# Patient Record
Sex: Female | Born: 1955 | Race: White | Hispanic: No | Marital: Married | Smoking: Former smoker
Health system: Southern US, Community
[De-identification: ages and names within clinical notes are randomized; demographics above are authoritative.]

## PROBLEM LIST (undated history)

## (undated) DIAGNOSIS — R3915 Urgency of urination: Secondary | ICD-10-CM

## (undated) DIAGNOSIS — E7800 Pure hypercholesterolemia, unspecified: Secondary | ICD-10-CM

## (undated) DIAGNOSIS — G4730 Sleep apnea, unspecified: Secondary | ICD-10-CM

## (undated) DIAGNOSIS — K5792 Diverticulitis of intestine, part unspecified, without perforation or abscess without bleeding: Secondary | ICD-10-CM

## (undated) DIAGNOSIS — F329 Major depressive disorder, single episode, unspecified: Secondary | ICD-10-CM

## (undated) DIAGNOSIS — D649 Anemia, unspecified: Secondary | ICD-10-CM

## (undated) DIAGNOSIS — N159 Renal tubulo-interstitial disease, unspecified: Secondary | ICD-10-CM

## (undated) DIAGNOSIS — K219 Gastro-esophageal reflux disease without esophagitis: Secondary | ICD-10-CM

## (undated) DIAGNOSIS — K2270 Barrett's esophagus without dysplasia: Secondary | ICD-10-CM

## (undated) DIAGNOSIS — G629 Polyneuropathy, unspecified: Secondary | ICD-10-CM

## (undated) DIAGNOSIS — I219 Acute myocardial infarction, unspecified: Secondary | ICD-10-CM

## (undated) DIAGNOSIS — F32A Depression, unspecified: Secondary | ICD-10-CM

## (undated) DIAGNOSIS — I2510 Atherosclerotic heart disease of native coronary artery without angina pectoris: Secondary | ICD-10-CM

## (undated) DIAGNOSIS — E119 Type 2 diabetes mellitus without complications: Secondary | ICD-10-CM

## (undated) DIAGNOSIS — K3184 Gastroparesis: Secondary | ICD-10-CM

## (undated) DIAGNOSIS — M1990 Unspecified osteoarthritis, unspecified site: Secondary | ICD-10-CM

## (undated) DIAGNOSIS — J449 Chronic obstructive pulmonary disease, unspecified: Secondary | ICD-10-CM

## (undated) DIAGNOSIS — I10 Essential (primary) hypertension: Secondary | ICD-10-CM

## (undated) DIAGNOSIS — J302 Other seasonal allergic rhinitis: Secondary | ICD-10-CM

## (undated) HISTORY — PX: ABDOMINAL HYSTERECTOMY: SHX81

## (undated) HISTORY — PX: CORONARY ANGIOPLASTY: SHX604

## (undated) HISTORY — PX: TUBAL LIGATION: SHX77

## (undated) HISTORY — PX: CHOLECYSTECTOMY: SHX55

## (undated) HISTORY — PX: BLADDER SUSPENSION: SHX72

## (undated) HISTORY — PX: JOINT REPLACEMENT: SHX530

## (undated) HISTORY — PX: NASAL SINUS SURGERY: SHX719

## (undated) HISTORY — PX: KNEE ARTHROSCOPY: SUR90

---

## 1981-03-23 DIAGNOSIS — L409 Psoriasis, unspecified: Secondary | ICD-10-CM

## 1981-03-23 HISTORY — DX: Psoriasis, unspecified: L40.9

## 1990-03-23 DIAGNOSIS — H9191 Unspecified hearing loss, right ear: Secondary | ICD-10-CM

## 1990-03-23 HISTORY — DX: Unspecified hearing loss, right ear: H91.91

## 1999-03-24 DIAGNOSIS — B259 Cytomegaloviral disease, unspecified: Secondary | ICD-10-CM

## 1999-03-24 HISTORY — DX: Cytomegaloviral disease, unspecified: B25.9

## 2001-03-23 HISTORY — PX: EYE SURGERY: SHX253

## 2008-09-20 DIAGNOSIS — I219 Acute myocardial infarction, unspecified: Secondary | ICD-10-CM

## 2008-09-20 HISTORY — DX: Acute myocardial infarction, unspecified: I21.9

## 2009-07-08 ENCOUNTER — Inpatient Hospital Stay (HOSPITAL_COMMUNITY): Admission: RE | Admit: 2009-07-08 | Discharge: 2009-07-10 | Payer: Self-pay | Admitting: Orthopedic Surgery

## 2010-03-23 HISTORY — PX: DILATION AND CURETTAGE OF UTERUS: SHX78

## 2010-06-10 LAB — BASIC METABOLIC PANEL
BUN: 12 mg/dL (ref 6–23)
BUN: 9 mg/dL (ref 6–23)
CO2: 28 mEq/L (ref 19–32)
Calcium: 8.5 mg/dL (ref 8.4–10.5)
Calcium: 8.5 mg/dL (ref 8.4–10.5)
Chloride: 107 mEq/L (ref 96–112)
GFR calc non Af Amer: >60 mL/min (ref >60)
GFR calc non Af Amer: >60 mL/min (ref >60)
Glucose, Bld: 104 mg/dL — ABNORMAL HIGH (ref 70–99)
Glucose, Bld: 109 mg/dL — ABNORMAL HIGH (ref 70–99)
Potassium: 4.1 mEq/L (ref 3.5–5.1)

## 2010-06-10 LAB — CBC
Hemoglobin: 10.8 g/dL — ABNORMAL LOW (ref 12.0–15.0)
MCHC: 33.4 g/dL (ref 30.0–36.0)
MCHC: 33.7 g/dL (ref 30.0–36.0)
RBC: 3.49 MIL/uL — ABNORMAL LOW (ref 3.87–5.11)
WBC: 7.3 10*3/uL (ref 4.0–10.5)

## 2010-06-10 LAB — TYPE AND SCREEN: Antibody Screen: NEGATIVE

## 2010-06-11 LAB — DIFFERENTIAL
Basophils Absolute: 0 10*3/uL (ref 0.0–0.1)
Basophils Relative: 1 % (ref 0–1)
Eosinophils Absolute: 0.2 10*3/uL (ref 0.0–0.7)
Eosinophils Relative: 3 % (ref 0–5)
Lymphocytes Relative: 39 % (ref 12–46)
Monocytes Absolute: 0.4 10*3/uL (ref 0.1–1.0)
Neutro Abs: 3.4 10*3/uL (ref 1.7–7.7)

## 2010-06-11 LAB — BASIC METABOLIC PANEL
BUN: 13 mg/dL (ref 6–23)
Chloride: 107 mEq/L (ref 96–112)
GFR calc Af Amer: >60 mL/min (ref >60)
Potassium: 3.6 mEq/L (ref 3.5–5.1)
Sodium: 141 mEq/L (ref 135–145)

## 2010-06-11 LAB — URINALYSIS, ROUTINE W REFLEX MICROSCOPIC
Ketones, ur: NEGATIVE mg/dL
Nitrite: NEGATIVE
Protein, ur: NEGATIVE mg/dL
Urobilinogen, UA: 0.2 mg/dL (ref 0.0–1.0)

## 2010-06-11 LAB — CBC
HCT: 41.6 % (ref 36.0–46.0)
Hemoglobin: 13.9 g/dL (ref 12.0–15.0)

## 2011-12-25 ENCOUNTER — Ambulatory Visit (INDEPENDENT_AMBULATORY_CARE_PROVIDER_SITE_OTHER): Payer: BC Managed Care – PPO

## 2011-12-25 ENCOUNTER — Other Ambulatory Visit: Payer: Self-pay | Admitting: Orthopedic Surgery

## 2011-12-25 DIAGNOSIS — R52 Pain, unspecified: Secondary | ICD-10-CM

## 2011-12-25 DIAGNOSIS — Z96659 Presence of unspecified artificial knee joint: Secondary | ICD-10-CM

## 2011-12-25 DIAGNOSIS — W19XXXA Unspecified fall, initial encounter: Secondary | ICD-10-CM

## 2011-12-25 DIAGNOSIS — M25559 Pain in unspecified hip: Secondary | ICD-10-CM

## 2013-12-06 ENCOUNTER — Encounter (HOSPITAL_COMMUNITY): Payer: Self-pay | Admitting: Pharmacy Technician

## 2013-12-12 NOTE — Patient Instructions (Signed)
20 Kayla Bailey  12/12/2013   Your procedure is scheduled on:     12/19/2013  Report to [facility] Main Entrance and follow signs to  Short Stay Center  arrive at 1120 AM.   Call this number if you have problems the morning of surgery [phone] or Presurgical Testing [phone].   Remember:  Do not eat food After Midnight, ,may have clear liquids until 0730 am day of surgery then nothing by mouth.      Take these medicines the morning of surgery with A SIP OF WATER: Cymbalta;Gabapentin;Pantroprazole;Inderal                               You may not have any metal on your body including hair pins and piercings  Do not wear jewelry, make-up, lotions, powders, or deodorant.  Do not shave body hair  48 hours(2 days) of CHG soap use.               Do not bring valuables to the hospital. [facility] IS NOT RESPONSIBLE FOR VALUABLES.  Contacts, dentures or bridgework may not be worn into surgery.  Leave suitcase in the car. After surgery it may be brought to your room.  For patients admitted to the hospital, checkout time is 11:00 AM the day of discharge.   Special Instructions: review fact sheets for MRSA information, Blood Transfusion fact sheet, Incentive Spirometry.  Remember: Type/Screen "Blue armsbands"- may not be removed once applied(would result in being retested AM of surgery, if removed). ________________________________________________________________________  [facility] - Preparing for Surgery Before surgery, you can play an important role.  Because skin is not sterile, your skin needs to be as free of germs as possible.  You can reduce the number of germs on your skin by washing with CHG (chlorahexidine gluconate) soap before surgery.  CHG is an antiseptic cleaner which kills germs and bonds with the skin to continue killing germs even after washing. Please DO NOT use if you have an allergy to CHG or antibacterial soaps.  If your skin becomes reddened/irritated  stop using the CHG and inform your nurse when you arrive at Short Stay. Do not shave (including legs and underarms) for at least 48 hours prior to the first CHG shower.  You may shave your face/neck. Please follow these instructions carefully:  1.  Shower with CHG Soap the night before surgery and the  morning of Surgery.  2.  If you choose to wash your hair, wash your hair first as usual with your  normal  shampoo.  3.  After you shampoo, rinse your hair and body thoroughly to remove the  shampoo.                           4.  Use CHG as you would any other liquid soap.  You can apply chg directly  to the skin and wash                       Gently with a scrungie or clean washcloth.  5.  Apply the CHG Soap to your body ONLY FROM THE NECK DOWN.   Do not use on face/ open                           Wound or open sores. Avoid contact  with eyes, ears mouth and genitals (private parts).                       Wash face,  Genitals (private parts) with your normal soap.             6.  Wash thoroughly, paying special attention to the area where your surgery  will be performed.  7.  Thoroughly rinse your body with warm water from the neck down.  8.  DO NOT shower/wash with your normal soap after using and rinsing off  the CHG Soap.                9.  Pat yourself dry with a clean towel.            10.  Wear clean pajamas.            11.  Place clean sheets on your bed the night of your first shower and do not  sleep with pets. Day of Surgery : Do not apply any lotions/deodorants the morning of surgery.  Please wear clean clothes to the hospital/surgery center.  FAILURE TO FOLLOW THESE INSTRUCTIONS MAY RESULT IN THE CANCELLATION OF YOUR SURGERY PATIENT SIGNATURE_________________________________  NURSE SIGNATURE__________________________________  ________________________________________________________________________    CLEAR LIQUID DIET   Foods Allowed                                                                      Foods Excluded  Coffee and tea, regular and decaf                             liquids that you cannot  Plain Jell-O in any flavor                                             see through such as: Fruit ices (not with fruit pulp)                                     milk, soups, orange juice  Iced Popsicles                                    All solid food Carbonated beverages, regular and diet                                    Cranberry, grape and apple juices Sports drinks like Gatorade Lightly seasoned clear broth or consume(fat free) Sugar, honey syrup  Sample Menu Breakfast                                Lunch  Supper Cranberry juice                    Beef broth                            Chicken broth Jell-O                                     Grape juice                           Apple juice Coffee or tea                        Jell-O                                      Popsicle                                                Coffee or tea                        Coffee or tea  _____________________________________________________________________    Incentive Spirometer  An incentive spirometer is a tool that can help keep your lungs clear and active. This tool measures how well you are filling your lungs with each breath. Taking long deep breaths may help reverse or decrease the chance of developing breathing (pulmonary) problems (especially infection) following:  A long period of time when you are unable to move or be active. BEFORE THE PROCEDURE   If the spirometer includes an indicator to show your best effort, your nurse or respiratory therapist will set it to a desired goal.  If possible, sit up straight or lean slightly forward. Try not to slouch.  Hold the incentive spirometer in an upright position. INSTRUCTIONS FOR USE  1. Sit on the edge of your bed if possible, or sit up as far as you can in bed or on a  chair. 2. Hold the incentive spirometer in an upright position. 3. Breathe out normally. 4. Place the mouthpiece in your mouth and seal your lips tightly around it. 5. Breathe in slowly and as deeply as possible, raising the piston or the ball toward the top of the column. 6. Hold your breath for 3-5 seconds or for as long as possible. Allow the piston or ball to fall to the bottom of the column. 7. Remove the mouthpiece from your mouth and breathe out normally. 8. Rest for a few seconds and repeat Steps 1 through 7 at least 10 times every 1-2 hours when you are awake. Take your time and take a few normal breaths between deep breaths. 9. The spirometer may include an indicator to show your best effort. Use the indicator as a goal to work toward during each repetition. 10. After each set of 10 deep breaths, practice coughing to be sure your lungs are clear. If you have an incision (the cut made at the time of surgery), support your incision when coughing by placing a pillow or rolled up towels firmly against  it. Once you are able to get out of bed, walk around indoors and cough well. You may stop using the incentive spirometer when instructed by your caregiver.  RISKS AND COMPLICATIONS  Take your time so you do not get dizzy or light-headed.  If you are in pain, you may need to take or ask for pain medication before doing incentive spirometry. It is harder to take a deep breath if you are having pain. AFTER USE  Rest and breathe slowly and easily.  It can be helpful to keep track of a log of your progress. Your caregiver can provide you with a simple table to help with this. If you are using the spirometer at home, follow these instructions: SEEK MEDICAL CARE IF:   You are having difficultly using the spirometer.  You have trouble using the spirometer as often as instructed.  Your pain medication is not giving enough relief while using the spirometer.  You develop fever of 100.5 F  (38.1 C) or higher. SEEK IMMEDIATE MEDICAL CARE IF:   You cough up bloody sputum that had not been present before.  You develop fever of 102 F (38.9 C) or greater.  You develop worsening pain at or near the incision site. MAKE SURE YOU:   Understand these instructions.  Will watch your condition.  Will get help right away if you are not doing well or get worse. Document Released: 07/20/2006 Document Revised: 06/01/2011 Document Reviewed: 09/20/2006 ExitCare Patient Information 2014 ExitCare, [place].   ________________________________________________________________________  WHAT IS A BLOOD TRANSFUSION? Blood Transfusion Information  A transfusion is the replacement of blood or some of its parts. Blood is made up of multiple cells which provide different functions.  Red blood cells carry oxygen and are used for blood loss replacement.  White blood cells fight against infection.  Platelets control bleeding.  Plasma helps clot blood.  Other blood products are available for specialized needs, such as hemophilia or other clotting disorders. BEFORE THE TRANSFUSION  Who gives blood for transfusions?   Healthy volunteers who are fully evaluated to make sure their blood is safe. This is blood bank blood. Transfusion therapy is the safest it has ever been in the practice of medicine. Before blood is taken from a donor, a complete history is taken to make sure that person has no history of diseases nor engages in risky social behavior (examples are intravenous drug use or sexual activity with multiple partners). The donor's travel history is screened to minimize risk of transmitting infections, such as malaria. The donated blood is tested for signs of infectious diseases, such as HIV and hepatitis. The blood is then tested to be sure it is compatible with you in order to minimize the chance of a transfusion reaction. If you or a relative donates blood, this is often done in anticipation  of surgery and is not appropriate for emergency situations. It takes many days to process the donated blood. RISKS AND COMPLICATIONS Although transfusion therapy is very safe and saves many lives, the main dangers of transfusion include:   Getting an infectious disease.  Developing a transfusion reaction. This is an allergic reaction to something in the blood you were given. Every precaution is taken to prevent this. The decision to have a blood transfusion has been considered carefully by your caregiver before blood is given. Blood is not given unless the benefits outweigh the risks. AFTER THE TRANSFUSION  Right after receiving a blood transfusion, you will usually feel much better and more  energetic. This is especially true if your red blood cells have gotten low (anemic). The transfusion raises the level of the red blood cells which carry oxygen, and this usually causes an energy increase.  The nurse administering the transfusion will monitor you carefully for complications. HOME CARE INSTRUCTIONS  No special instructions are needed after a transfusion. You may find your energy is better. Speak with your caregiver about any limitations on activity for underlying diseases you may have. SEEK MEDICAL CARE IF:   Your condition is not improving after your transfusion.  You develop redness or irritation at the intravenous (IV) site. SEEK IMMEDIATE MEDICAL CARE IF:  Any of the following symptoms occur over the next 12 hours:  Shaking chills.  You have a temperature by mouth above 102 F (38.9 C), not controlled by medicine.  Chest, back, or muscle pain.  People around you feel you are not acting correctly or are confused.  Shortness of breath or difficulty breathing.  Dizziness and fainting.  You get a rash or develop hives.  You have a decrease in urine output.  Your urine turns a dark color or changes to pink, red, or brown. Any of the following symptoms occur over the next 10  days:  You have a temperature by mouth above 102 F (38.9 C), not controlled by medicine.  Shortness of breath.  Weakness after normal activity.  The white part of the eye turns yellow (jaundice).  You have a decrease in the amount of urine or are urinating less often.  Your urine turns a dark color or changes to pink, red, or brown. Document Released: 03/06/2000 Document Revised: 06/01/2011 Document Reviewed: 10/24/2007 [facility] Patient Information 2014 [place], [place].  _______________________________________________________________________

## 2013-12-13 ENCOUNTER — Encounter (HOSPITAL_COMMUNITY): Payer: Self-pay

## 2013-12-13 ENCOUNTER — Encounter (HOSPITAL_COMMUNITY)
Admission: RE | Admit: 2013-12-13 | Discharge: 2013-12-13 | Disposition: A | Discharge status: Home / Self Care | Payer: BC Managed Care – PPO | Source: Ambulatory Visit | Attending: Orthopedic Surgery | Admitting: Orthopedic Surgery

## 2013-12-13 ENCOUNTER — Ambulatory Visit (HOSPITAL_COMMUNITY)
Admission: RE | Admit: 2013-12-13 | Discharge: 2013-12-13 | Disposition: A | Discharge status: Home / Self Care | Payer: BC Managed Care – PPO | Source: Ambulatory Visit | Attending: Orthopedic Surgery | Admitting: Orthopedic Surgery

## 2013-12-13 DIAGNOSIS — I10 Essential (primary) hypertension: Secondary | ICD-10-CM | POA: Diagnosis not present

## 2013-12-13 HISTORY — DX: Sleep apnea, unspecified: G47.30

## 2013-12-13 HISTORY — DX: Essential (primary) hypertension: I10

## 2013-12-13 HISTORY — DX: Polyneuropathy, unspecified: G62.9

## 2013-12-13 HISTORY — DX: Urgency of urination: R39.15

## 2013-12-13 HISTORY — DX: Acute myocardial infarction, unspecified: I21.9

## 2013-12-13 HISTORY — DX: Gastro-esophageal reflux disease without esophagitis: K21.9

## 2013-12-13 HISTORY — DX: Anemia, unspecified: D64.9

## 2013-12-13 HISTORY — DX: Unspecified osteoarthritis, unspecified site: M19.90

## 2013-12-13 LAB — URINALYSIS, ROUTINE W REFLEX MICROSCOPIC
Bilirubin Urine: NEGATIVE
GLUCOSE, UA: NEGATIVE mg/dL
Hgb urine dipstick: NEGATIVE
Ketones, ur: NEGATIVE mg/dL
LEUKOCYTES UA: NEGATIVE
Nitrite: NEGATIVE
PH: 6.5 (ref 5.0–8.0)
Protein, ur: NEGATIVE mg/dL
Specific Gravity, Urine: 1.006 (ref 1.005–1.030)
Urobilinogen, UA: 0.2 mg/dL (ref 0.0–1.0)

## 2013-12-13 LAB — CBC
HEMATOCRIT: 30.5 % — AB (ref 36.0–46.0)
Hemoglobin: 9.2 g/dL — ABNORMAL LOW (ref 12.0–15.0)
MCH: 23 pg — ABNORMAL LOW (ref 26.0–34.0)
MCHC: 30.2 g/dL (ref 30.0–36.0)
MCV: 76.3 fL — AB (ref 78.0–100.0)
PLATELETS: 337 10*3/uL (ref 150–400)
RBC: 4 MIL/uL (ref 3.87–5.11)
RDW: 16.8 % — AB (ref 11.5–15.5)
WBC: 5 10*3/uL (ref 4.0–10.5)

## 2013-12-13 LAB — BASIC METABOLIC PANEL
ANION GAP: 12 (ref 5–15)
BUN: 11 mg/dL (ref 6–23)
CALCIUM: 8.8 mg/dL (ref 8.4–10.5)
CO2: 23 meq/L (ref 19–32)
CREATININE: 0.73 mg/dL (ref 0.50–1.10)
Chloride: 103 mEq/L (ref 96–112)
GFR calc Af Amer: >90 mL/min (ref >90)
GFR calc non Af Amer: >90 mL/min (ref >90)
Glucose, Bld: 127 mg/dL — ABNORMAL HIGH (ref 70–99)
Potassium: 4.1 mEq/L (ref 3.7–5.3)
Sodium: 138 mEq/L (ref 137–147)

## 2013-12-13 LAB — SURGICAL PCR SCREEN
MRSA, PCR: NEGATIVE
Staphylococcus aureus: POSITIVE — AB

## 2013-12-13 LAB — PROTIME-INR
INR: 1.03 (ref 0.00–1.49)
Prothrombin Time: 13.5 seconds (ref 11.6–15.2)

## 2013-12-13 LAB — APTT: aPTT: 33 seconds (ref 24–37)

## 2013-12-13 NOTE — Progress Notes (Signed)
Email sent to patient at [email or web address] for EMMI education.  Booklet also given to patient.

## 2013-12-13 NOTE — Progress Notes (Signed)
LOV ntoe DR Zena Amos( cardiology ) on chart- 11/10/2013.  EKG on chart- 11/10/2013.   Clearance- Dr Zena Amos on chart.

## 2013-12-13 NOTE — Progress Notes (Signed)
CBC results faxed via EPIC to Dr Olin.   

## 2013-12-16 NOTE — H&P (Signed)
TOTAL KNEE ADMISSION H&P  Patient is being admitted for left total knee arthroplasty.  Subjective:  Chief Complaint:    Left knee OA / pain.  HPI: Kayla Bailey, 58 y.o. female, has a history of pain and functional disability in the left knee due to arthritis and has failed non-surgical conservative treatments for greater than 12 weeks to includeNSAID's and/or analgesics, corticosteriod injections, use of assistive devices and activity modification.  Onset of symptoms was gradual, starting 4+ years ago with gradually worsening course since that time. The patient noted prior procedures on the knee to include  arthroplasty on the right knee in 2011 per Dr. Charlann Boxer.  Patient currently rates pain in the left knee(s) at 8 out of 10 with activity. Patient has night pain, worsening of pain with activity and weight bearing, pain that interferes with activities of daily living, pain with passive range of motion, crepitus and joint swelling.  Patient has evidence of periarticular osteophytes and joint space narrowing by imaging studies. There is no active infection. Risks, benefits and expectations were discussed with the patient.  Risks including but not limited to the risk of anesthesia, blood clots, nerve damage, blood vessel damage, failure of the prosthesis, infection and up to and including death.  Patient understand the risks, benefits and expectations and wishes to proceed with surgery.   PCP: Garlon Hatchet, MD  D/C Plans:      Home with HHPT  Post-op Meds:       No Rx given   Tranexamic Acid:      To be given - topically (previous MI)  Decadron:      Is to be given  FYI:     Plavix and ASA post-op  Norco post-op  (patient states that they are good with Norco, even with Codeine allergy)   Past Medical History  Diagnosis Date  . Hypertension   . Myocardial infarction 09/2008   . Sleep apnea     cpap-   . Urgency of urination   . GERD (gastroesophageal reflux disease)   . Arthritis     osteoarthritis, psoriatic arthritis   . Anemia     hx of years ago   . Peripheral neuropathy     Past Surgical History  Procedure Laterality Date  . Tubal ligation    . Cholecystectomy    . Knee arthroscopy      right knee   . Joint replacement      right knee - 2011     No prescriptions prior to admission   Allergies  Allergen Reactions  . Codeine Itching and Nausea Only    History  Substance Use Topics  . Smoking status: Current Every Day Smoker -- 1.00 packs/day for 42 years    Types: Cigarettes  . Smokeless tobacco: Never Used  . Alcohol Use: Yes     Comment: rare    No family history on file.   Review of Systems  Constitutional: Positive for malaise/fatigue.  HENT: Positive for hearing loss and tinnitus.   Eyes: Negative.   Respiratory: Negative.   Cardiovascular: Negative.   Gastrointestinal: Positive for heartburn.  Genitourinary: Positive for urgency and frequency.  Musculoskeletal: Positive for joint pain.  Skin: Negative.   Neurological: Negative.   Endo/Heme/Allergies: Negative.   Psychiatric/Behavioral: Negative.     Objective:  Physical Exam  Constitutional: She is oriented to person, place, and time. She appears well-developed and well-nourished.  HENT:  Head: Normocephalic and atraumatic.  Eyes: Pupils are equal, round, and  reactive to light.  Neck: Neck supple. No JVD present. No tracheal deviation present. No thyromegaly present.  Cardiovascular: Normal rate, regular rhythm, normal heart sounds and intact distal pulses.   Respiratory: Effort normal and breath sounds normal. No stridor. No respiratory distress. She has no wheezes.  GI: Soft. There is no tenderness. There is no guarding.  Musculoskeletal:       Left knee: She exhibits decreased range of motion, swelling, abnormal alignment (valgus) and bony tenderness. She exhibits no ecchymosis, no deformity, no laceration and no erythema. Tenderness found.  Lymphadenopathy:    She has no  cervical adenopathy.  Neurological: She is alert and oriented to person, place, and time. A sensory deficit (some neuropathy of the feet) is present.  Skin: Skin is warm and dry.  Psychiatric: She has a normal mood and affect.     Imaging Review Plain radiographs demonstrate severe degenerative joint disease of the left knee(s). The overall alignment is significant valgus. The bone quality appears to be good for age and reported activity level.  Assessment/Plan:  End stage arthritis, left knee   The patient history, physical examination, clinical judgment of the provider and imaging studies are consistent with end stage degenerative joint disease of the left knee(s) and total knee arthroplasty is deemed medically necessary. The treatment options including medical management, injection therapy arthroscopy and arthroplasty were discussed at length. The risks and benefits of total knee arthroplasty were presented and reviewed. The risks due to aseptic loosening, infection, stiffness, patella tracking problems, thromboembolic complications and other imponderables were discussed. The patient acknowledged the explanation, agreed to proceed with the plan and consent was signed. Patient is being admitted for inpatient treatment for surgery, pain control, PT, OT, prophylactic antibiotics, VTE prophylaxis, progressive ambulation and ADL's and discharge planning. The patient is planning to be discharged home with home health services.    Anastasio Auerbach Baran Kuhrt   PA-C  12/16/2013, 12:13 AM

## 2013-12-18 NOTE — Progress Notes (Signed)
Patient calls and wants to know if she should take CHG showers preop as her psoriasis has flared up since holding methotrixate and remicade. She states she has a few open areas ; however, none on her left knee. RN instructs her to take showers.

## 2013-12-19 ENCOUNTER — Inpatient Hospital Stay (HOSPITAL_COMMUNITY): Payer: BC Managed Care – PPO | Admitting: Certified Registered"

## 2013-12-19 ENCOUNTER — Inpatient Hospital Stay (HOSPITAL_COMMUNITY)
Admission: RE | Admit: 2013-12-19 | Discharge: 2013-12-20 | DRG: 470 | Disposition: A | Discharge status: Home Health | Payer: BC Managed Care – PPO | Source: Ambulatory Visit | Attending: Orthopedic Surgery | Admitting: Orthopedic Surgery

## 2013-12-19 ENCOUNTER — Encounter (HOSPITAL_COMMUNITY)
Admission: RE | Disposition: A | Discharge status: Home Health | Payer: Self-pay | Source: Ambulatory Visit | Attending: Orthopedic Surgery

## 2013-12-19 ENCOUNTER — Encounter (HOSPITAL_COMMUNITY): Payer: BC Managed Care – PPO | Admitting: Certified Registered"

## 2013-12-19 ENCOUNTER — Encounter (HOSPITAL_COMMUNITY): Payer: Self-pay | Admitting: *Deleted

## 2013-12-19 DIAGNOSIS — F17200 Nicotine dependence, unspecified, uncomplicated: Secondary | ICD-10-CM | POA: Diagnosis present

## 2013-12-19 DIAGNOSIS — Z96659 Presence of unspecified artificial knee joint: Secondary | ICD-10-CM | POA: Diagnosis not present

## 2013-12-19 DIAGNOSIS — Z96652 Presence of left artificial knee joint: Secondary | ICD-10-CM

## 2013-12-19 DIAGNOSIS — K219 Gastro-esophageal reflux disease without esophagitis: Secondary | ICD-10-CM | POA: Diagnosis present

## 2013-12-19 DIAGNOSIS — M1710 Unilateral primary osteoarthritis, unspecified knee: Secondary | ICD-10-CM | POA: Diagnosis present

## 2013-12-19 DIAGNOSIS — I10 Essential (primary) hypertension: Secondary | ICD-10-CM | POA: Diagnosis present

## 2013-12-19 DIAGNOSIS — M6580 Other synovitis and tenosynovitis, unspecified site: Secondary | ICD-10-CM | POA: Diagnosis present

## 2013-12-19 DIAGNOSIS — I252 Old myocardial infarction: Secondary | ICD-10-CM | POA: Diagnosis not present

## 2013-12-19 DIAGNOSIS — G4730 Sleep apnea, unspecified: Secondary | ICD-10-CM | POA: Diagnosis present

## 2013-12-19 DIAGNOSIS — M25569 Pain in unspecified knee: Secondary | ICD-10-CM | POA: Diagnosis present

## 2013-12-19 HISTORY — PX: TOTAL KNEE ARTHROPLASTY: SHX125

## 2013-12-19 LAB — TYPE AND SCREEN
ABO/RH(D): A POS
Antibody Screen: NEGATIVE

## 2013-12-19 SURGERY — ARTHROPLASTY, KNEE, TOTAL
Anesthesia: Spinal | Site: Knee | Laterality: Left

## 2013-12-19 MED ORDER — PANTOPRAZOLE SODIUM 40 MG PO TBEC
40.0000 mg | DELAYED_RELEASE_TABLET | Freq: Every morning | ORAL | Status: DC
  Administered 2013-12-20: 40 mg via ORAL
  Filled 2013-12-19: qty 1

## 2013-12-19 MED ORDER — CEFAZOLIN SODIUM-DEXTROSE 2-3 GM-% IV SOLR
2.0000 g | INTRAVENOUS | Status: AC
  Administered 2013-12-19: 2 g via INTRAVENOUS

## 2013-12-19 MED ORDER — SODIUM CHLORIDE 0.9 % IR SOLN
Status: DC | PRN
  Administered 2013-12-19: 1000 mL

## 2013-12-19 MED ORDER — MIDAZOLAM HCL 5 MG/5ML IJ SOLN
INTRAMUSCULAR | Status: DC | PRN
  Administered 2013-12-19: 2 mg via INTRAVENOUS

## 2013-12-19 MED ORDER — ONDANSETRON HCL 4 MG/2ML IJ SOLN: 4.0000 mg | Freq: Four times a day (QID) | INTRAMUSCULAR | Status: DC | PRN

## 2013-12-19 MED ORDER — KETOROLAC TROMETHAMINE 30 MG/ML IJ SOLN
INTRAMUSCULAR | Status: DC | PRN
  Administered 2013-12-19: 30 mg via INTRAVENOUS

## 2013-12-19 MED ORDER — GABAPENTIN 600 MG PO TABS
1200.0000 mg | ORAL_TABLET | Freq: Two times a day (BID) | ORAL | Status: DC
  Filled 2013-12-19: qty 2

## 2013-12-19 MED ORDER — ONDANSETRON HCL 4 MG/2ML IJ SOLN
INTRAMUSCULAR | Status: DC | PRN
  Administered 2013-12-19: 4 mg via INTRAVENOUS

## 2013-12-19 MED ORDER — SENNA 8.6 MG PO TABS
1.0000 | ORAL_TABLET | Freq: Two times a day (BID) | ORAL | Status: DC
  Administered 2013-12-19 – 2013-12-20 (×2): 8.6 mg via ORAL

## 2013-12-19 MED ORDER — DEXAMETHASONE SODIUM PHOSPHATE 10 MG/ML IJ SOLN
10.0000 mg | Freq: Once | INTRAMUSCULAR | Status: AC
  Administered 2013-12-19: 10 mg via INTRAVENOUS

## 2013-12-19 MED ORDER — DSS 100 MG PO CAPS: 100.0000 mg | ORAL_CAPSULE | Freq: Two times a day (BID) | ORAL | Status: DC

## 2013-12-19 MED ORDER — METOCLOPRAMIDE HCL 5 MG/ML IJ SOLN: 5.0000 mg | Freq: Three times a day (TID) | INTRAMUSCULAR | Status: DC | PRN

## 2013-12-19 MED ORDER — ATORVASTATIN CALCIUM 80 MG PO TABS
80.0000 mg | ORAL_TABLET | Freq: Every day | ORAL | Status: DC
  Administered 2013-12-19: 80 mg via ORAL
  Filled 2013-12-19 (×2): qty 1

## 2013-12-19 MED ORDER — METHOCARBAMOL 500 MG PO TABS
500.0000 mg | ORAL_TABLET | Freq: Four times a day (QID) | ORAL | Status: DC | PRN
  Administered 2013-12-19 – 2013-12-20 (×2): 500 mg via ORAL
  Filled 2013-12-19 (×4): qty 1

## 2013-12-19 MED ORDER — PROPOFOL 10 MG/ML IV BOLUS
INTRAVENOUS | Status: DC | PRN
  Administered 2013-12-19: 20 mg via INTRAVENOUS

## 2013-12-19 MED ORDER — HYDROCODONE-ACETAMINOPHEN 7.5-325 MG PO TABS
1.0000 | ORAL_TABLET | ORAL | Status: DC
  Administered 2013-12-19: 1 via ORAL
  Administered 2013-12-19: 2 via ORAL
  Administered 2013-12-19: 1 via ORAL
  Administered 2013-12-20 (×3): 2 via ORAL
  Filled 2013-12-19: qty 2
  Filled 2013-12-19: qty 1
  Filled 2013-12-19 (×4): qty 2

## 2013-12-19 MED ORDER — ZOLPIDEM TARTRATE 5 MG PO TABS: 5.0000 mg | ORAL_TABLET | Freq: Every evening | ORAL | Status: DC | PRN

## 2013-12-19 MED ORDER — PROPRANOLOL HCL 60 MG PO TABS
60.0000 mg | ORAL_TABLET | Freq: Every morning | ORAL | Status: DC
  Administered 2013-12-20: 60 mg via ORAL
  Filled 2013-12-19: qty 1

## 2013-12-19 MED ORDER — PROPOFOL INFUSION 10 MG/ML OPTIME
INTRAVENOUS | Status: DC | PRN
  Administered 2013-12-19: 100 ug/kg/min via INTRAVENOUS

## 2013-12-19 MED ORDER — BUPIVACAINE IN DEXTROSE 0.75-8.25 % IT SOLN
INTRATHECAL | Status: DC | PRN
  Administered 2013-12-19: 1.8 mL via INTRATHECAL

## 2013-12-19 MED ORDER — ONDANSETRON HCL 4 MG/2ML IJ SOLN
INTRAMUSCULAR | Status: AC
  Filled 2013-12-19: qty 2

## 2013-12-19 MED ORDER — ASPIRIN EC 81 MG PO TBEC
81.0000 mg | DELAYED_RELEASE_TABLET | Freq: Every day | ORAL | Status: DC
  Administered 2013-12-20: 81 mg via ORAL
  Filled 2013-12-19: qty 1

## 2013-12-19 MED ORDER — ALUM & MAG HYDROXIDE-SIMETH 200-200-20 MG/5ML PO SUSP: 30.0000 mL | ORAL | Status: DC | PRN

## 2013-12-19 MED ORDER — CLOPIDOGREL BISULFATE 75 MG PO TABS
75.0000 mg | ORAL_TABLET | Freq: Every morning | ORAL | Status: DC
Start: 2013-12-20 — End: 2013-12-20
  Administered 2013-12-20: 75 mg via ORAL
  Filled 2013-12-19: qty 1

## 2013-12-19 MED ORDER — FOLIC ACID 1 MG PO TABS
1.0000 mg | ORAL_TABLET | Freq: Every day | ORAL | Status: DC
  Administered 2013-12-19: 1 mg via ORAL
  Filled 2013-12-19 (×2): qty 1

## 2013-12-19 MED ORDER — LACTATED RINGERS IV SOLN
INTRAVENOUS | Status: AC
Start: 2013-12-19 — End: 2013-12-20
  Administered 2013-12-19: 14:00:00 via INTRAVENOUS
  Administered 2013-12-19: 1000 mL via INTRAVENOUS

## 2013-12-19 MED ORDER — MEPERIDINE HCL 50 MG/ML IJ SOLN: 6.2500 mg | INTRAMUSCULAR | Status: DC | PRN

## 2013-12-19 MED ORDER — BUPIVACAINE-EPINEPHRINE (PF) 0.25% -1:200000 IJ SOLN
INTRAMUSCULAR | Status: AC
  Filled 2013-12-19: qty 30

## 2013-12-19 MED ORDER — TRANEXAMIC ACID 100 MG/ML IV SOLN
2000.0000 mg | Freq: Once | INTRAVENOUS | Status: DC
  Filled 2013-12-19 (×2): qty 20

## 2013-12-19 MED ORDER — MENTHOL 3 MG MT LOZG: 1.0000 | LOZENGE | OROMUCOSAL | Status: DC | PRN

## 2013-12-19 MED ORDER — CEFAZOLIN SODIUM-DEXTROSE 2-3 GM-% IV SOLR
2.0000 g | Freq: Four times a day (QID) | INTRAVENOUS | Status: AC
  Administered 2013-12-19 (×2): 2 g via INTRAVENOUS
  Filled 2013-12-19 (×2): qty 50

## 2013-12-19 MED ORDER — PROMETHAZINE HCL 25 MG/ML IJ SOLN: 6.2500 mg | INTRAMUSCULAR | Status: DC | PRN

## 2013-12-19 MED ORDER — DEXAMETHASONE SODIUM PHOSPHATE 10 MG/ML IJ SOLN
10.0000 mg | Freq: Once | INTRAMUSCULAR | Status: DC
  Filled 2013-12-19: qty 1

## 2013-12-19 MED ORDER — 0.9 % SODIUM CHLORIDE (POUR BTL) OPTIME
TOPICAL | Status: DC | PRN
  Administered 2013-12-19: 1000 mL

## 2013-12-19 MED ORDER — HYDROMORPHONE HCL 1 MG/ML IJ SOLN
0.5000 mg | INTRAMUSCULAR | Status: DC | PRN
  Administered 2013-12-19: 1 mg via INTRAVENOUS
  Filled 2013-12-19: qty 1

## 2013-12-19 MED ORDER — SODIUM CHLORIDE 0.9 % IJ SOLN
INTRAMUSCULAR | Status: AC
  Filled 2013-12-19: qty 10

## 2013-12-19 MED ORDER — PHENOL 1.4 % MT LIQD: 1.0000 | OROMUCOSAL | Status: DC | PRN

## 2013-12-19 MED ORDER — KETOROLAC TROMETHAMINE 30 MG/ML IJ SOLN
INTRAMUSCULAR | Status: AC
  Filled 2013-12-19: qty 1

## 2013-12-19 MED ORDER — SODIUM CHLORIDE 0.9 % IJ SOLN
INTRAMUSCULAR | Status: DC | PRN
  Administered 2013-12-19: 30 mL via INTRAVENOUS

## 2013-12-19 MED ORDER — PROPOFOL 10 MG/ML IV BOLUS
INTRAVENOUS | Status: AC
  Filled 2013-12-19: qty 20

## 2013-12-19 MED ORDER — DIPHENHYDRAMINE HCL 12.5 MG/5ML PO ELIX: 25.0000 mg | ORAL_SOLUTION | Freq: Four times a day (QID) | ORAL | Status: DC | PRN

## 2013-12-19 MED ORDER — GABAPENTIN 400 MG PO CAPS
1200.0000 mg | ORAL_CAPSULE | Freq: Two times a day (BID) | ORAL | Status: DC
  Administered 2013-12-19 – 2013-12-20 (×2): 1200 mg via ORAL
  Filled 2013-12-19 (×3): qty 3

## 2013-12-19 MED ORDER — DEXAMETHASONE SODIUM PHOSPHATE 10 MG/ML IJ SOLN
INTRAMUSCULAR | Status: AC
  Filled 2013-12-19: qty 1

## 2013-12-19 MED ORDER — HYDROMORPHONE HCL 1 MG/ML IJ SOLN
0.2500 mg | INTRAMUSCULAR | Status: DC | PRN
  Administered 2013-12-19: 0.5 mg via INTRAVENOUS

## 2013-12-19 MED ORDER — METOCLOPRAMIDE HCL 10 MG PO TABS: 5.0000 mg | ORAL_TABLET | Freq: Three times a day (TID) | ORAL | Status: DC | PRN

## 2013-12-19 MED ORDER — SODIUM CHLORIDE 0.9 % IV SOLN
INTRAVENOUS | Status: DC
  Administered 2013-12-19 – 2013-12-20 (×2): via INTRAVENOUS
  Filled 2013-12-19 (×4): qty 1000

## 2013-12-19 MED ORDER — FENTANYL CITRATE 0.05 MG/ML IJ SOLN
INTRAMUSCULAR | Status: DC | PRN
  Administered 2013-12-19 (×2): 25 ug via INTRAVENOUS
  Administered 2013-12-19: 50 ug via INTRAVENOUS

## 2013-12-19 MED ORDER — METHOCARBAMOL 1000 MG/10ML IJ SOLN
500.0000 mg | Freq: Four times a day (QID) | INTRAVENOUS | Status: DC | PRN
  Administered 2013-12-19: 500 mg via INTRAVENOUS
  Filled 2013-12-19: qty 5

## 2013-12-19 MED ORDER — TRANEXAMIC ACID 100 MG/ML IV SOLN
2000.0000 mg | INTRAVENOUS | Status: DC | PRN
  Administered 2013-12-19: 2000 mg via TOPICAL

## 2013-12-19 MED ORDER — CHLORHEXIDINE GLUCONATE 4 % EX LIQD
60.0000 mL | Freq: Once | CUTANEOUS | Status: DC
Start: 2013-12-19 — End: 2013-12-19

## 2013-12-19 MED ORDER — HYDROMORPHONE HCL 1 MG/ML IJ SOLN
INTRAMUSCULAR | Status: AC
  Filled 2013-12-19: qty 1

## 2013-12-19 MED ORDER — POLYETHYLENE GLYCOL 3350 17 G PO PACK: 17.0000 g | PACK | Freq: Every day | ORAL | Status: DC | PRN

## 2013-12-19 MED ORDER — BUPIVACAINE-EPINEPHRINE (PF) 0.25% -1:200000 IJ SOLN
INTRAMUSCULAR | Status: DC | PRN
  Administered 2013-12-19: 30 mL

## 2013-12-19 MED ORDER — MIDAZOLAM HCL 2 MG/2ML IJ SOLN
INTRAMUSCULAR | Status: AC
  Filled 2013-12-19: qty 2

## 2013-12-19 MED ORDER — ONDANSETRON HCL 4 MG PO TABS: 4.0000 mg | ORAL_TABLET | Freq: Four times a day (QID) | ORAL | Status: DC | PRN

## 2013-12-19 MED ORDER — BUPIVACAINE LIPOSOME 1.3 % IJ SUSP
20.0000 mL | Freq: Once | INTRAMUSCULAR | Status: AC
  Administered 2013-12-19: 20 mL
  Filled 2013-12-19: qty 20

## 2013-12-19 MED ORDER — FERROUS SULFATE 325 (65 FE) MG PO TABS
325.0000 mg | ORAL_TABLET | Freq: Three times a day (TID) | ORAL | Status: DC
  Filled 2013-12-19 (×5): qty 1

## 2013-12-19 MED ORDER — FENTANYL CITRATE 0.05 MG/ML IJ SOLN
INTRAMUSCULAR | Status: AC
  Filled 2013-12-19: qty 2

## 2013-12-19 MED ORDER — DOCUSATE SODIUM 100 MG PO CAPS
100.0000 mg | ORAL_CAPSULE | Freq: Two times a day (BID) | ORAL | Status: DC
  Administered 2013-12-19 – 2013-12-20 (×2): 100 mg via ORAL

## 2013-12-19 MED ORDER — HYDROCODONE-ACETAMINOPHEN 7.5-325 MG PO TABS: 1.0000 | ORAL_TABLET | ORAL | Status: DC

## 2013-12-19 MED ORDER — DULOXETINE HCL 60 MG PO CPEP
60.0000 mg | ORAL_CAPSULE | Freq: Two times a day (BID) | ORAL | Status: DC
  Administered 2013-12-19 – 2013-12-20 (×2): 60 mg via ORAL
  Filled 2013-12-19 (×3): qty 1

## 2013-12-19 MED ORDER — METHOCARBAMOL 500 MG PO TABS: 500.0000 mg | ORAL_TABLET | Freq: Four times a day (QID) | ORAL | Status: DC | PRN

## 2013-12-19 MED ORDER — CEFAZOLIN SODIUM-DEXTROSE 2-3 GM-% IV SOLR
INTRAVENOUS | Status: AC
  Filled 2013-12-19: qty 50

## 2013-12-19 SURGICAL SUPPLY — 49 items
BAG ZIPLOCK 12X15 (MISCELLANEOUS) IMPLANT
BANDAGE ELASTIC 6 VELCRO ST LF (GAUZE/BANDAGES/DRESSINGS) ×3 IMPLANT
BANDAGE ESMARK 6X9 LF (GAUZE/BANDAGES/DRESSINGS) ×1 IMPLANT
BLADE SAW SGTL 13.0X1.19X90.0M (BLADE) ×3 IMPLANT
BNDG ESMARK 6X9 LF (GAUZE/BANDAGES/DRESSINGS) ×3
BOWL SMART MIX CTS (DISPOSABLE) ×3 IMPLANT
CAP KNEE ATTUNE RP ×3 IMPLANT
CEMENT HV SMART SET (Cement) ×3 IMPLANT
CUFF TOURN SGL QUICK 34 (TOURNIQUET CUFF) ×2
CUFF TRNQT CYL 34X4X40X1 (TOURNIQUET CUFF) ×1 IMPLANT
DERMABOND ADVANCED (GAUZE/BANDAGES/DRESSINGS) ×2
DERMABOND ADVANCED .7 DNX12 (GAUZE/BANDAGES/DRESSINGS) ×1 IMPLANT
DRAPE EXTREMITY TIBURON (DRAPES) ×3 IMPLANT
DRAPE POUCH INSTRU U-SHP 10X18 (DRAPES) ×3 IMPLANT
DRAPE U-SHAPE 47X51 STRL (DRAPES) ×3 IMPLANT
DRSG AQUACEL AG ADV 3.5X10 (GAUZE/BANDAGES/DRESSINGS) ×3 IMPLANT
DURAPREP 26ML APPLICATOR (WOUND CARE) ×6 IMPLANT
ELECT REM PT RETURN 9FT ADLT (ELECTROSURGICAL) ×3
ELECTRODE REM PT RTRN 9FT ADLT (ELECTROSURGICAL) ×1 IMPLANT
FACESHIELD WRAPAROUND (MASK) ×15 IMPLANT
GLOVE BIOGEL PI IND STRL 7.5 (GLOVE) ×1 IMPLANT
GLOVE BIOGEL PI IND STRL 8.5 (GLOVE) ×1 IMPLANT
GLOVE BIOGEL PI INDICATOR 7.5 (GLOVE) ×2
GLOVE BIOGEL PI INDICATOR 8.5 (GLOVE) ×2
GLOVE ECLIPSE 8.0 STRL XLNG CF (GLOVE) ×3 IMPLANT
GLOVE ORTHO TXT STRL SZ7.5 (GLOVE) ×6 IMPLANT
GOWN SPEC L3 XXLG W/TWL (GOWN DISPOSABLE) ×3 IMPLANT
GOWN STRL REUS W/TWL LRG LVL3 (GOWN DISPOSABLE) ×3 IMPLANT
HANDPIECE INTERPULSE COAX TIP (DISPOSABLE) ×2
KIT BASIN OR (CUSTOM PROCEDURE TRAY) ×3 IMPLANT
MANIFOLD NEPTUNE II (INSTRUMENTS) ×3 IMPLANT
NDL SAFETY ECLIPSE 18X1.5 (NEEDLE) ×1 IMPLANT
NEEDLE HYPO 18GX1.5 SHARP (NEEDLE) ×2
PACK TOTAL JOINT (CUSTOM PROCEDURE TRAY) ×3 IMPLANT
POSITIONER SURGICAL ARM (MISCELLANEOUS) ×3 IMPLANT
SET HNDPC FAN SPRY TIP SCT (DISPOSABLE) ×1 IMPLANT
SET PAD KNEE POSITIONER (MISCELLANEOUS) ×3 IMPLANT
SUCTION FRAZIER 12FR DISP (SUCTIONS) ×3 IMPLANT
SUT MNCRL AB 4-0 PS2 18 (SUTURE) ×3 IMPLANT
SUT VIC AB 1 CT1 36 (SUTURE) ×3 IMPLANT
SUT VIC AB 2-0 CT1 27 (SUTURE) ×6
SUT VIC AB 2-0 CT1 TAPERPNT 27 (SUTURE) ×3 IMPLANT
SUT VLOC 180 0 24IN GS25 (SUTURE) ×3 IMPLANT
SYR 50ML LL SCALE MARK (SYRINGE) ×3 IMPLANT
TOWEL OR 17X26 10 PK STRL BLUE (TOWEL DISPOSABLE) ×3 IMPLANT
TOWEL OR NON WOVEN STRL DISP B (DISPOSABLE) IMPLANT
TRAY FOLEY CATH 14FRSI W/METER (CATHETERS) ×3 IMPLANT
WATER STERILE IRR 1500ML POUR (IV SOLUTION) ×3 IMPLANT
WRAP KNEE MAXI GEL POST OP (GAUZE/BANDAGES/DRESSINGS) ×3 IMPLANT

## 2013-12-19 NOTE — Anesthesia Postprocedure Evaluation (Signed)
Anesthesia Post Note  Patient: Kayla Bailey  Procedure(s) Performed: Procedure(s) (LRB): LEFT TOTAL KNEE ARTHROPLASTY (Left)  Anesthesia type: Spinal  Patient location: PACU  Post pain: Pain level controlled  Post assessment: Post-op Vital signs reviewed  Last Vitals: BP 111/54  Pulse 61  Temp(Src) 36.4 C (Oral)  Resp 15  Ht 5\' 7"  (1.702 m)  Wt 220 lb (99.791 kg)  BMI 34.45 kg/m2  SpO2 95%  Post vital signs: Reviewed  Level of consciousness: sedated  Complications: No apparent anesthesia complications

## 2013-12-19 NOTE — Interval H&P Note (Signed)
History and Physical Interval Note:  12/19/2013 11:27 AM  Kayla Bailey  has presented today for surgery, with the diagnosis of left knee osteoarthritis  The various methods of treatment have been discussed with the patient and family. After consideration of risks, benefits and other options for treatment, the patient has consented to  Procedure(s): LEFT TOTAL KNEE ARTHROPLASTY (Left) as a surgical intervention .  The patient's history has been reviewed, patient examined, no change in status, stable for surgery.  I have reviewed the patient's chart and labs.  Questions were answered to the patient's satisfaction.     Shelda PalLIN,Tran Randle D

## 2013-12-19 NOTE — Anesthesia Procedure Notes (Signed)
Spinal  Patient location during procedure: OR Start time: 12/19/2013 12:50 PM End time: 12/19/2013 12:55 PM Staffing Anesthesiologist: Nickie Retort CRNA/Resident: Lajuana Carry E Performed by: resident/CRNA  Preanesthetic Checklist Completed: patient identified, site marked, surgical consent, pre-op evaluation, timeout performed, IV checked, risks and benefits discussed and monitors and equipment checked Spinal Block Patient position: sitting Prep: Betadine Patient monitoring: heart rate, continuous pulse ox and blood pressure Approach: midline Location: L3-4 Injection technique: single-shot Needle Needle type: Sprotte  Needle gauge: 24 G Needle length: 10 cm Assessment Sensory level: T6 Additional Notes Kit expiration checked, time out performed, sitting. L3-4 x1, clear CSF pre/post injection. Tol well, return to supine.

## 2013-12-19 NOTE — Progress Notes (Signed)
[facility]  [facility] the [facility]: Patient declined rw and commode - has both at home already  If patient discharges after hours, please call [phone].   Renard HamperLecretia Williamson 12/19/2013, 6:05 PM

## 2013-12-19 NOTE — Anesthesia Preprocedure Evaluation (Addendum)
Anesthesia Evaluation  Patient identified by MRN, date of birth, ID band Patient awake    Reviewed: Allergy & Precautions, H&P , NPO status , Patient's Chart, lab work & pertinent test results  Airway       Dental   Pulmonary neg pulmonary ROS, sleep apnea , Current Smoker,          Cardiovascular hypertension, Pt. on medications and Pt. on home beta blockers + Past MI and + Cardiac Stents     Neuro/Psych negative neurological ROS  negative psych ROS   GI/Hepatic Neg liver ROS, GERD-  ,  Endo/Other  negative endocrine ROS  Renal/GU negative Renal ROS     Musculoskeletal  (+) Arthritis -,   Abdominal   Peds  Hematology  (+) anemia ,   Anesthesia Other Findings   Reproductive/Obstetrics negative OB ROS                          Anesthesia Physical Anesthesia Plan  ASA: III  Anesthesia Plan: Spinal   Post-op Pain Management:    Induction:   Airway Management Planned:   Additional Equipment:   Intra-op Plan:   Post-operative Plan:   Informed Consent: I have reviewed the patients History and Physical, chart, labs and discussed the procedure including the risks, benefits and alternatives for the proposed anesthesia with the patient or authorized representative who has indicated his/her understanding and acceptance.   Dental advisory given  Plan Discussed with: CRNA  Anesthesia Plan Comments:         Anesthesia Quick Evaluation

## 2013-12-19 NOTE — Op Note (Signed)
NAME:  Kayla Bailey                      MEDICAL RECORD NO.:  [id number]                             FACILITY:  [facility]      PHYSICIAN:  Madlyn Frankel. Charlann Boxer, M.D.  DATE OF BIRTH:  1955/11/08      DATE OF PROCEDURE:  12/19/2013                                     OPERATIVE REPORT         PREOPERATIVE DIAGNOSIS:  Left knee osteoarthritis.      POSTOPERATIVE DIAGNOSIS:  Left knee osteoarthritis.      FINDINGS:  The patient was noted to have complete loss of cartilage and   bone-on-bone arthritis with associated osteophytes in the lateral and patellofemoral compartments of   the knee with a significant synovitis and associated effusion.      PROCEDURE:  Left total knee replacement.      COMPONENTS USED:  DePuy Attune rotating platform posterior stabilized knee   system, a size 5N femur, 5 tibia, size 6 mm AOX PS insert, and 38 anatomic patellar   button.      SURGEON:  Madlyn Frankel. Charlann Boxer, M.D.      ASSISTANT:  Leilani Able, PA-C.      ANESTHESIA:  Spinal.      SPECIMENS:  None.      COMPLICATION:  None.      DRAINS:  None.  EBL: <100cc      TOURNIQUET TIME:   Total Tourniquet Time Documented: Thigh (Right) - 30 minutes Total: Thigh (Right) - 30 minutes       The patient was stable to the recovery room.      INDICATION FOR PROCEDURE:  Kayla Bailey is a 58 y.o. female patient of   mine.  The patient had been seen, evaluated, and treated conservatively in the   office with medication, activity modification, and injections.  The patient had   radiographic changes of bone-on-bone arthritis with endplate sclerosis and osteophytes noted.      The patient failed conservative measures including medication, injections, and activity modification, and at this point was ready for more definitive measures.   Based on the radiographic changes and failed conservative measures, the patient   decided to proceed with total knee replacement.  Risks of infection,   DVT, component failure,  need for revision surgery, postop course, and   expectations were all   discussed and reviewed.  Consent was obtained for benefit of pain   relief.      PROCEDURE IN DETAIL:  The patient was brought to the operative theater.   Once adequate anesthesia, preoperative antibiotics, 2 gm of Ancef administered, the patient was positioned supine with the left thigh tourniquet placed.  The  left lower extremity was prepped and draped in sterile fashion.  A time-   out was performed identifying the patient, planned procedure, and   extremity.      The left lower extremity was placed in the [facility] leg holder.  The leg was   exsanguinated, tourniquet elevated to 250 mmHg.  A midline incision was   made followed by median parapatellar arthrotomy.  Following initial   exposure,  attention was first directed to the patella.  Precut   measurement was noted to be 24 mm.  I resected down to 14 mm and used a   38 patellar button to restore patellar height as well as cover the cut   surface.      The lug holes were drilled and a metal shim was placed to protect the   patella from retractors and saw blades.      At this point, attention was now directed to the femur.  The femoral   canal was opened with a drill, irrigated to try to prevent fat emboli.  An   intramedullary rod was passed at 5 degrees valgus, 9 mm of bone was   resected off the distal femur.  Following this resection, the tibia was   subluxated anteriorly.  Using the extramedullary guide, 3 mm of bone was resected off   the proximal lateral tibia.  We confirmed the gap would be   stable medially and laterally with a size 5 insert as well as confirmed   the cut was perpendicular in the coronal plane, checking with an alignment rod.      Once this was done, I sized the femur to be a size 5 in the anterior-   posterior dimension, chose a narrow component based on medial and   lateral dimension.  The size 5 rotation block was then pinned in    position anterior referenced using the tensioning device to set rotation and match extension and flexion gaps.  The   anterior, posterior, and  chamfer cuts were made without difficulty nor   notching making certain that I was along the anterior cortex to help   with flexion gap stability.      The final box cut was made off the lateral aspect of distal femur.      At this point, the tibia was sized to be a size 5, the size 5 tray was   then pinned in position through the medial third of the tubercle,   drilled, and keel punched.  Trial reduction was now carried with a 5 femur,  5 tibia, a size 5 then 6 mm insert, and the 38 patella botton.  The knee was brought to   extension, full extension with good flexion stability with the patella   tracking through the trochlea without application of pressure.  Given   all these findings, the trial components removed.  Final components were   opened and cement was mixed.  The knee was irrigated with normal saline   solution and pulse lavage.  The synovial lining was   then injected with 20cc of Exparel, 30 cc of 0.25% Marcaine with epinephrine and 1 cc of Toradol,   total of 61 cc.      The knee was irrigated.  Final implants were then cemented onto clean and   dried cut surfaces of bone with the knee brought to extension with a size 6 trial insert.      Once the cement had fully cured, the excess cement was removed   throughout the knee.  I confirmed I was satisfied with the range of   motion and stability, and the final size 6 mm AOX PS insert was chosen.  It was   placed into the knee.      The tourniquet had been let down at 30 minutes.  No significant   hemostasis required.  The   extensor mechanism was then reapproximated using #  1 Vicryl with the knee   in flexion.  The   remaining wound was closed with 2-0 Vicryl and running 4-0 Monocryl.   The knee was cleaned, dried, dressed sterilely using Dermabond and   Aquacel dressing.  The  patient was then   brought to recovery room in stable condition, tolerating the procedure   well.   Please note that Physician Assistant, Leilani AbleSteve Chabon, PA-C, was present for the entirety of the case, and was utilized for pre-operative positioning, peri-operative retractor management, general facilitation of the procedure.  He was also utilized for primary wound closure at the end of the case.              Madlyn FrankelMatthew D. Charlann Boxerlin, M.D.    12/19/2013 2:16 PM

## 2013-12-19 NOTE — Discharge Instructions (Signed)
Weight bearing as tolerated Home Health Agency will follow you at home for your therapy  Do not remove your dressing. Shower only, no tub bath. You may shower with the dressing on. Call if any temperatures greater than 101 or any wound complications: [phone]

## 2013-12-19 NOTE — Transfer of Care (Signed)
Immediate Anesthesia Transfer of Care Note  Patient: Kayla Bailey  Procedure(s) Performed: Procedure(s) (LRB): LEFT TOTAL KNEE ARTHROPLASTY (Left)  Patient Location: PACU  Anesthesia Type: Spinal  Level of Consciousness: sedated, patient cooperative and responds to stimulation  Airway & Oxygen Therapy: Patient Spontanous Breathing and Patient connected to face mask oxgen  Post-op Assessment: Report given to PACU RN and Post -op Vital signs reviewed and stable  Post vital signs: Reviewed and stable  Complications: No apparent anesthesia complications

## 2013-12-20 LAB — BASIC METABOLIC PANEL
Anion gap: 11 (ref 5–15)
BUN: 14 mg/dL (ref 6–23)
CO2: 23 meq/L (ref 19–32)
CREATININE: 0.7 mg/dL (ref 0.50–1.10)
Calcium: 8.9 mg/dL (ref 8.4–10.5)
Chloride: 104 mEq/L (ref 96–112)
GFR calc Af Amer: >90 mL/min (ref >90)
GLUCOSE: 143 mg/dL — AB (ref 70–99)
Potassium: 4.4 mEq/L (ref 3.7–5.3)
Sodium: 138 mEq/L (ref 137–147)

## 2013-12-20 LAB — CBC
HCT: 31.1 % — ABNORMAL LOW (ref 36.0–46.0)
Hemoglobin: 9.1 g/dL — ABNORMAL LOW (ref 12.0–15.0)
MCH: 23.7 pg — ABNORMAL LOW (ref 26.0–34.0)
MCHC: 29.3 g/dL — AB (ref 30.0–36.0)
MCV: 81 fL (ref 78.0–100.0)
Platelets: 310 10*3/uL (ref 150–400)
RBC: 3.84 MIL/uL — AB (ref 3.87–5.11)
RDW: 20.8 % — ABNORMAL HIGH (ref 11.5–15.5)
WBC: 7.7 10*3/uL (ref 4.0–10.5)

## 2013-12-20 NOTE — Progress Notes (Signed)
Utilization review completed.  

## 2013-12-20 NOTE — Care Management Note (Addendum)
    Page 1 of 2   12/20/2013     12:41:03 PM CARE MANAGEMENT NOTE 12/20/2013  Patient:  Kayla Bailey, Kayla Bailey   Account Number:  [id number]  Date Initiated:  12/20/2013  Documentation initiated by:  [facility]  Subjective/Objective Assessment:   adm: LEFT TOTAL KNEE ARTHROPLASTY (Left)     Action/Plan:   discharge planning   Anticipated DC Date:  12/20/2013   Anticipated DC Plan:  [place]  CM consult      [facility] Choice  HOME HEALTH   Choice offered to / List presented to:  C-1 Patient   DME arranged  [place]      DME agency  [place] arranged  [facility]   Status of service:  Completed, signed off Medicare Important Message given?   (If response is "NO", the following Medicare IM given date fields will be blank) Date Medicare IM given:   Medicare IM given by:   Date Additional Medicare IM given:   Additional Medicare IM given by:    Discharge Disposition:  [place]  Per UR Regulation:    If discussed at Long Length of Stay Meetings, dates discussed:    Comments:  12/20/13 10:30 CM met with pt in room to offer choice of home health agency.  Pt chooses [facility].  address and contact information vierified with pt.  Referral called to [facility] who referred me to main intake number [phone].  Ebony Hail requested I explain I have already checked in with Ebony Hail locally and staffing is available for [facility] tomorrow 12/21/13.  Main intake instructed me to fax facesheet, orders, H&P, OP note and DC note to [phone]. DME rep, Pura Spice to deliver 3n1 and rolling walker to room prior to discharge.   No other CM needs were communicated.  Mariane Masters, BSN, CM [phone].

## 2013-12-20 NOTE — Evaluation (Signed)
Occupational Therapy Evaluation Patient Details Name: Kayla PieriniDiane E Bailey MRN: [id number] DOB: [birth year] Today's Date: 12/20/2013    History of Present Illness LTKA   Clinical Impression   Education complete regarding ADL activity s/p TKA.  Pt will obtain elevated toilet seat. Family will A as needed. Pt doing well and ready to Dc home    Follow Up Recommendations  No OT follow up          Precautions / Restrictions Precautions Precautions: None      Mobility Bed Mobility               General bed mobility comments: pt in chair  Transfers Overall transfer level: Needs assistance Equipment used: Rolling walker (2 wheeled) Transfers: Sit to/from BJ'sStand;Stand Pivot Transfers Sit to Stand: Supervision Stand pivot transfers: Supervision                 ADL Overall ADL's : Needs assistance/impaired                     Lower Body Dressing: Sit to/from stand;Min guard   Toilet Transfer: Supervision/safety;Ambulation;Comfort height toilet   Toileting- Clothing Manipulation and Hygiene: Supervision/safety;Sit to/from stand   Tub/ Shower Transfer: Walk-in shower;Min guard   Functional mobility during ADLs: MicrobiologistMin guard;Rolling walker          Perception     Praxis      Pertinent Vitals/Pain Pain Assessment: 0-10 Pain Score: 3  Pain Descriptors / Indicators: Sore Pain Intervention(s): Monitored during session;Repositioned;Ice applied        Extremity/Trunk Assessment Upper Extremity Assessment Upper Extremity Assessment: Overall WFL for tasks assessed           Communication Communication Communication: No difficulties   Cognition Arousal/Alertness: Awake/alert Behavior During Therapy: WFL for tasks assessed/performed Overall Cognitive Status: Within Functional Limits for tasks assessed                                Home Living Family/patient expects to be discharged to:: Private residence Living Arrangements:  Spouse/significant other Available Help at Discharge: Family               Bathroom Shower/Tub: Walk-in Human resources officershower   Bathroom Toilet: Standard     Home Equipment: Environmental consultantWalker - 2 wheels;Bedside commode          Prior Functioning/Environment Level of Independence: Independent                                       End of Session Equipment Utilized During Treatment: Rolling walker  Activity Tolerance: Patient tolerated treatment well Patient left: in chair;with call bell/phone within reach;with family/visitor present   Time: 4098-11911023-1044 OT Time Calculation (min): 21 min Charges:  OT General Charges $OT Visit: 1 Procedure OT Evaluation $Initial OT Evaluation Tier I: 1 Procedure OT Treatments $Self Care/Home Management : 8-22 mins G-Codes:    Einar CrowEDDING, Kayla Bailey D 12/20/2013, 10:53 AM

## 2013-12-20 NOTE — Evaluation (Signed)
Physical Therapy Evaluation Patient Details Name: Kayla PieriniDiane E Bailey MRN: [id number] DOB: [birth year] Today's Date: 12/20/2013   History of Present Illness  LTKA  Clinical Impression  Pt s/p L TKR presents with decreased L LE strength/ROM and post op pain limiting functional mobility.  Pt should progress well to d/c home with family assist and HHPT follow up.    Follow Up Recommendations Home health PT    Equipment Recommendations  None recommended by PT    Recommendations for Other Services OT consult     Precautions / Restrictions Precautions Precautions: None Restrictions Weight Bearing Restrictions: No Other Position/Activity Restrictions: WBAT      Mobility  Bed Mobility Overal bed mobility: Needs Assistance Bed Mobility: Supine to Sit     Supine to sit: Min assist     General bed mobility comments: pt in chair  Transfers Overall transfer level: Needs assistance Equipment used: Rolling walker (2 wheeled) Transfers: Sit to/from BJ'sStand;Stand Pivot Transfers Sit to Stand: Supervision Stand pivot transfers: Supervision       General transfer comment: cues for LE management and use of UEs to self assist  Ambulation/Gait Ambulation/Gait assistance: Min assist;Min guard Ambulation Distance (Feet): 64 Feet Assistive device: Rolling walker (2 wheeled) Gait Pattern/deviations: Step-to pattern;Decreased step length - right;Decreased step length - left;Shuffle;Trunk flexed     General Gait Details: cues for sequence, posture and position from RW - distance ltd by elevating pain  Stairs            Wheelchair Mobility    Modified Rankin (Stroke Patients Only)       Balance                                             Pertinent Vitals/Pain Pain Assessment: 0-10 Pain Score: 3  Pain Location: L knee Pain Descriptors / Indicators: Sore Pain Intervention(s): Monitored during session;Repositioned;Ice applied    Home Living  Family/patient expects to be discharged to:: Private residence Living Arrangements: Spouse/significant other Available Help at Discharge: Family Type of Home: House Home Access: Ramped entrance     Home Layout: One level Home Equipment: Environmental consultantWalker - 2 wheels;Bedside commode      Prior Function Level of Independence: Independent               Hand Dominance        Extremity/Trunk Assessment   Upper Extremity Assessment: Overall WFL for tasks assessed           Lower Extremity Assessment: LLE deficits/detail   LLE Deficits / Details: 3/5 quads with AAROM at knee -10- 100  Cervical / Trunk Assessment: Normal  Communication   Communication: No difficulties  Cognition Arousal/Alertness: Awake/alert Behavior During Therapy: WFL for tasks assessed/performed Overall Cognitive Status: Within Functional Limits for tasks assessed                      General Comments      Exercises Total Joint Exercises Ankle Circles/Pumps: AROM;Both;Supine;15 reps Quad Sets: AROM;Both;10 reps;Supine Heel Slides: AAROM;10 reps;Supine;Left Straight Leg Raises: AAROM;AROM;Left;15 reps;Supine      Assessment/Plan    PT Assessment Patient needs continued PT services  PT Diagnosis Difficulty walking   PT Problem List Decreased strength;Decreased range of motion;Decreased activity tolerance;Decreased mobility;Decreased knowledge of use of DME;Pain  PT Treatment Interventions DME instruction;Gait training;Functional mobility training;Therapeutic activities;Therapeutic exercise;Patient/family education   PT  Goals (Current goals can be found in the Care Plan section) Acute Rehab PT Goals Patient Stated Goal: Resume previous lifestyle with decreased pain PT Goal Formulation: With patient Time For Goal Achievement: 12/27/13 Potential to Achieve Goals: Good    Frequency 7X/week   Barriers to discharge        Co-evaluation               End of Session Equipment  Utilized During Treatment: Gait belt Activity Tolerance: Patient tolerated treatment well Patient left: in chair;with call bell/phone within reach;with family/visitor present Nurse Communication: Mobility status         Time: 1610-9604 PT Time Calculation (min): 32 min   Charges:   PT Evaluation $Initial PT Evaluation Tier I: 1 Procedure PT Treatments $Gait Training: 8-22 mins $Therapeutic Exercise: 8-22 mins   PT G Codes:          Allannah Kempen 12/20/2013, 12:20 PM

## 2013-12-20 NOTE — Progress Notes (Signed)
Patient ID: Kayla PieriniDiane E Bailey, female   DOB: 04-03-1955, 58 y.o.   MRN: [id number] Subjective: 1 Day Post-Op Procedure(s) (LRB): LEFT TOTAL KNEE ARTHROPLASTY (Left)    Patient reports pain as mild to moderate.  Slept well last night  Objective:   VITALS:   Filed Vitals:   12/20/13 0517  BP: 114/64  Pulse: 60  Temp: 97.8 F (36.6 C)  Resp: 16    Neurovascular intact Incision: dressing C/D/I  LABS  Recent Labs  12/20/13 0418  HGB 9.1*  HCT 31.1*  WBC 7.7  PLT 310     Recent Labs  12/20/13 0418  NA 138  K 4.4  BUN 14  CREATININE 0.70  GLUCOSE 143*    No results found for this basename: LABPT, INR,  in the last 72 hours   Assessment/Plan: 1 Day Post-Op Procedure(s) (LRB): LEFT TOTAL KNEE ARTHROPLASTY (Left)   Advance diet Up with therapy Discharge home with home health today after therapy  RTC in 2 weeks, goals reviewed

## 2013-12-27 NOTE — Discharge Summary (Signed)
Physician Discharge Summary  Patient ID: Kayla Bailey MRN: [id number] DOB/AGE: Jun 11, 1955 58 y.o.  Admit date: 12/19/2013 Discharge date: 12/20/2013   Procedures:  Procedure(s) (LRB): LEFT TOTAL KNEE ARTHROPLASTY (Left)  Attending Physician:  Kayla Bailey   Admission Diagnoses:   Left knee OA / pain  Discharge Diagnoses:  Active Problems:   S/P total knee arthroplasty  Past Medical History  Diagnosis Date  . Hypertension   . Myocardial infarction 09/2008   . Sleep apnea     cpap-   . Urgency of urination   . GERD (gastroesophageal reflux disease)   . Arthritis     osteoarthritis, psoriatic arthritis   . Anemia     hx of years ago   . Peripheral neuropathy     HPI: Kayla Bailey, 58 y.o. female, has a history of pain and functional disability in the left knee due to arthritis and has failed non-surgical conservative treatments for greater than 12 weeks to includeNSAID's and/or analgesics, corticosteriod injections, use of assistive devices and activity modification. Onset of symptoms was gradual, starting 4+ years ago with gradually worsening course since that time. The patient noted prior procedures on the knee to include arthroplasty on the right knee in 2011 per Dr. Charlann Bailey. Patient currently rates pain in the left knee(s) at 8 out of 10 with activity. Patient has night pain, worsening of pain with activity and weight bearing, pain that interferes with activities of daily living, pain with passive range of motion, crepitus and joint swelling. Patient has evidence of periarticular osteophytes and joint space narrowing by imaging studies. There is no active infection. Risks, benefits and expectations were discussed with the patient. Risks including but not limited to the risk of anesthesia, blood clots, nerve damage, blood vessel damage, failure of the prosthesis, infection and up to and including death. Patient understand the risks, benefits and expectations and wishes to  proceed with surgery.   PCP: Kayla HatchetMCCONVILLE,ROBERT, MD   Discharged Condition: good  Hospital Course:  Patient underwent the above stated procedure on 12/19/2013. Patient tolerated the procedure well and brought to the recovery room in good condition and subsequently to the floor.  POD #1 BP: 114/64 ; Pulse: 60 ; Temp: 97.8 F (36.6 C) ; Resp: 16  Patient reports pain as mild to moderate. Slept well last night.  Ready to be discharged home. Dorsiflexion/plantar flexion intact, incision: dressing C/D/I, no cellulitis present and compartment soft.   LABS  Basename    HGB  9.1  HCT  31.1    Discharge Exam: General appearance: alert, cooperative and no distress Extremities: Homans sign is negative, no sign of DVT, no edema, redness or tenderness in the calves or thighs and no ulcers, gangrene or trophic changes  Disposition: Home with follow up in 2 weeks   Follow-up Information   Follow up with Kayla Bailey,Kayla Bailey D, MD. Schedule an appointment as soon as possible for a visit in 2 weeks.   Specialty:  Orthopedic Surgery   Contact information:   [address] [place] [address] [phone]       Follow up with Kayla Bailey. (home health physical therapy)    Contact information:   [address] [place] [phone]      Discharge Instructions   Call MD / Call 911    Complete by:  As directed   If you experience chest pain or shortness of breath, CALL 911 and be transported to the hospital emergency  room.  If you develope a fever above 101 F, pus (white drainage) or increased drainage or redness at the wound, or calf pain, call your surgeon's office.     Constipation Prevention    Complete by:  As directed   Drink plenty of fluids.  Prune juice may be helpful.  You may use a stool softener, such as Colace (over the counter) 100 mg twice a day.  Use MiraLax (over the counter) for constipation as needed.     Diet - low sodium heart healthy     Complete by:  As directed      Discharge instructions    Complete by:  As directed   Weight bearing as tolerated Home Health Agency will follow you at home for your therapy  Do not remove your dressing. Shower only, no tub bath. You may shower with the dressing on. Call if any temperatures greater than 101 or any wound complications: [phone]     Do not put a pillow under the knee. Place it under the heel.    Complete by:  As directed      Driving restrictions    Complete by:  As directed   No driving for 2 weeks     Increase activity slowly as tolerated    Complete by:  As directed      TED hose    Complete by:  As directed   Use stockings (TED hose) for 3 weeks on both leg(s).  You may remove them at night for sleeping.             Medication List         aspirin EC 81 MG tablet  Take 81 mg by mouth at bedtime.     atorvastatin 80 MG tablet  Commonly known as:  LIPITOR  Take 80 mg by mouth at bedtime.     clobetasol ointment 0.05 %  Commonly known as:  TEMOVATE  Apply 1 application topically 2 (two) times daily.     clopidogrel 75 MG tablet  Commonly known as:  PLAVIX  Take 75 mg by mouth every morning.     DSS 100 MG Caps  Take 100 mg by mouth 2 (two) times daily.     DULoxetine 60 MG capsule  Commonly known as:  CYMBALTA  Take 60 mg by mouth 2 (two) times daily.     folic acid 1 MG tablet  Commonly known as:  FOLVITE  Take 1 mg by mouth at bedtime.     gabapentin 600 MG tablet  Commonly known as:  NEURONTIN  Take 1,200 mg by mouth 2 (two) times daily.     HYDROcodone-acetaminophen 7.5-325 MG per tablet  Commonly known as:  NORCO  Take 1-2 tablets by mouth every 4 (four) hours.     IRON PO  Take 65 mg by mouth daily. 5-10 tablets daily.     methocarbamol 500 MG tablet  Commonly known as:  ROBAXIN  Take 1 tablet (500 mg total) by mouth every 6 (six) hours as needed for muscle spasms.     OTREXUP 10 MG/0.4ML Soaj  Generic drug:  Methotrexate (PF)   Inject 1 each into the skin once a week.     pantoprazole 40 MG tablet  Commonly known as:  PROTONIX  Take 40 mg by mouth every morning.     propranolol 60 MG tablet  Commonly known as:  INDERAL  Take 60 mg by mouth every morning.  ramipril 5 MG capsule  Commonly known as:  ALTACE  Take 5 mg by mouth every morning.     REMICADE IV  Inject 1 each into the vein every 8 (eight) weeks.         Signed: Anastasio Auerbach. Dewan Emond   PA-C  12/27/2013, 7:31 PM

## 2014-01-02 ENCOUNTER — Other Ambulatory Visit (HOSPITAL_COMMUNITY): Payer: Self-pay | Admitting: Orthopedic Surgery

## 2014-01-02 ENCOUNTER — Ambulatory Visit (HOSPITAL_COMMUNITY)
Admission: RE | Admit: 2014-01-02 | Discharge: 2014-01-02 | Disposition: A | Discharge status: Home / Self Care | Payer: BC Managed Care – PPO | Source: Ambulatory Visit | Attending: Cardiology | Admitting: Cardiology

## 2014-01-02 DIAGNOSIS — Z96652 Presence of left artificial knee joint: Secondary | ICD-10-CM | POA: Diagnosis not present

## 2014-01-02 DIAGNOSIS — M9712XA Periprosthetic fracture around internal prosthetic left knee joint, initial encounter: Secondary | ICD-10-CM

## 2014-01-02 DIAGNOSIS — M79609 Pain in unspecified limb: Secondary | ICD-10-CM

## 2014-01-02 DIAGNOSIS — M79605 Pain in left leg: Secondary | ICD-10-CM

## 2014-01-02 DIAGNOSIS — M79662 Pain in left lower leg: Secondary | ICD-10-CM | POA: Diagnosis present

## 2014-01-02 NOTE — Progress Notes (Signed)
Lower Extremity Venous Duplex Completed. No evidence for DVT or SVT. °Brianna L Mazza,RVT °

## 2014-01-12 ENCOUNTER — Telehealth (HOSPITAL_COMMUNITY): Payer: Self-pay | Admitting: *Deleted

## 2015-03-24 HISTORY — PX: ROTATOR CUFF REPAIR: SHX139

## 2015-11-11 ENCOUNTER — Encounter: Payer: Self-pay | Admitting: *Deleted

## 2015-11-14 ENCOUNTER — Encounter: Admission: RE | Payer: Self-pay | Source: Ambulatory Visit

## 2015-11-14 ENCOUNTER — Ambulatory Visit: Admission: RE | Admit: 2015-11-14 | Payer: Self-pay | Source: Ambulatory Visit | Admitting: Ophthalmology

## 2015-11-14 HISTORY — DX: Pure hypercholesterolemia, unspecified: E78.00

## 2015-11-14 HISTORY — DX: Gastroparesis: K31.84

## 2015-11-14 HISTORY — DX: Other seasonal allergic rhinitis: J30.2

## 2015-11-14 HISTORY — DX: Depression, unspecified: F32.A

## 2015-11-14 HISTORY — DX: Major depressive disorder, single episode, unspecified: F32.9

## 2015-11-14 HISTORY — DX: Renal tubulo-interstitial disease, unspecified: N15.9

## 2015-11-14 HISTORY — DX: Chronic obstructive pulmonary disease, unspecified: J44.9

## 2015-11-14 HISTORY — DX: Barrett's esophagus without dysplasia: K22.70

## 2015-11-14 HISTORY — DX: Diverticulitis of intestine, part unspecified, without perforation or abscess without bleeding: K57.92

## 2015-11-14 HISTORY — DX: Atherosclerotic heart disease of native coronary artery without angina pectoris: I25.10

## 2015-11-14 HISTORY — DX: Type 2 diabetes mellitus without complications: E11.9

## 2015-11-14 SURGERY — PHACOEMULSIFICATION, CATARACT, WITH IOL INSERTION
Anesthesia: Choice | Laterality: Right

## 2020-03-23 DIAGNOSIS — M21371 Foot drop, right foot: Secondary | ICD-10-CM

## 2020-03-23 HISTORY — DX: Foot drop, right foot: M21.371

## 2020-11-14 ENCOUNTER — Other Ambulatory Visit (HOSPITAL_COMMUNITY): Payer: Self-pay | Admitting: Specialist

## 2020-11-14 ENCOUNTER — Other Ambulatory Visit: Payer: Self-pay

## 2020-11-14 ENCOUNTER — Ambulatory Visit (HOSPITAL_COMMUNITY)
Admission: RE | Admit: 2020-11-14 | Discharge: 2020-11-14 | Disposition: A | Discharge status: Home / Self Care | Payer: Medicare Other | Source: Ambulatory Visit | Attending: Specialist | Admitting: Specialist

## 2020-11-14 DIAGNOSIS — M79604 Pain in right leg: Secondary | ICD-10-CM

## 2020-11-18 ENCOUNTER — Other Ambulatory Visit: Payer: Self-pay | Admitting: Specialist

## 2020-11-18 DIAGNOSIS — M545 Low back pain, unspecified: Secondary | ICD-10-CM

## 2020-11-18 DIAGNOSIS — G8929 Other chronic pain: Secondary | ICD-10-CM

## 2020-11-21 ENCOUNTER — Ambulatory Visit
Admission: RE | Admit: 2020-11-21 | Discharge: 2020-11-21 | Disposition: A | Discharge status: Home / Self Care | Payer: Medicare Other | Source: Ambulatory Visit | Attending: Specialist | Admitting: Specialist

## 2020-11-21 ENCOUNTER — Other Ambulatory Visit: Payer: Self-pay

## 2020-11-21 DIAGNOSIS — G8929 Other chronic pain: Secondary | ICD-10-CM

## 2020-11-21 DIAGNOSIS — M545 Low back pain, unspecified: Secondary | ICD-10-CM

## 2020-11-21 MED ORDER — IOPAMIDOL (ISOVUE-M 200) INJECTION 41%
20.0000 mL | Freq: Once | INTRAMUSCULAR | Status: AC
  Administered 2020-11-21: 20 mL via INTRATHECAL

## 2020-11-21 MED ORDER — ONDANSETRON HCL 4 MG/2ML IJ SOLN
4.0000 mg | Freq: Once | INTRAMUSCULAR | Status: AC | PRN
  Administered 2020-11-21: 4 mg via INTRAMUSCULAR

## 2020-11-21 MED ORDER — MEPERIDINE HCL 50 MG/ML IJ SOLN
50.0000 mg | Freq: Once | INTRAMUSCULAR | Status: AC | PRN
  Administered 2020-11-21: 50 mg via INTRAMUSCULAR

## 2020-11-21 MED ORDER — DIAZEPAM 5 MG PO TABS
10.0000 mg | ORAL_TABLET | Freq: Once | ORAL | Status: AC
  Administered 2020-11-21: 10 mg via ORAL

## 2020-11-21 NOTE — Discharge Instructions (Signed)
Myelogram Discharge Instructions  Go home and rest quietly as needed. You may resume normal activities; however, do not exert yourself strongly or do any heavy lifting today and tomorrow.   DO NOT drive today.    You may resume your normal diet and medications unless otherwise indicated. Drink lots of extra fluids today and tomorrow.   The incidence of headache, nausea, or vomiting is about 5% (one in 20 patients).  If you develop a headache, lie flat for 24 hours and drink plenty of fluids until the headache goes away.  Caffeinated beverages may be helpful. If when you get up you still have a headache when standing, go back to bed and force fluids for another 24 hours.   If you develop severe nausea and vomiting or a headache that does not go away with the flat bedrest after 48 hours, please call [phone].   Call your physician for a follow-up appointment.  The results of your myelogram will be sent directly to your physician by the following day.  If you have any questions or if complications develop after you arrive home, please call [phone].  Discharge instructions have been explained to the patient.  The patient, or the person responsible for the patient, fully understands these instructions.   Thank you for visiting our office today.    YOU MAY RESUME YOUR ASPIRIN ANYTIME AFTER INJECTION TODAY

## 2020-11-21 NOTE — Discharge Instr - Other Orders (Addendum)
1100: pt c/o pain 8/10 from myelogram procedure. See MAR.  11:22 pt reports pain decreased; relief noted

## 2020-12-04 ENCOUNTER — Ambulatory Visit: Payer: Self-pay | Admitting: Orthopedic Surgery

## 2020-12-13 NOTE — Pre-Procedure Instructions (Signed)
Surgical Instructions:    Your procedure is scheduled on Thursday, September 29th.  Report to [facility] Main Entrance "A" at 05:30 A.M., then check in with the Admitting office.  Call this number if you have any questions prior to your surgery date, or have problems the morning of surgery:  [phone]    Remember:  Do not eat after midnight the night before your surgery.  You may drink clear liquids until 04:30 AM the morning of your surgery.   Clear liquids allowed are: Water, Non-Citrus Juices (without pulp), Carbonated Beverages, Clear Tea, Black Coffee Only, and Gatorade.   Enhanced Recovery after Surgery for Orthopedics Enhanced Recovery after Surgery is a protocol used to improve the stress on your body and your recovery after surgery.  Patient Instructions   The day of surgery (if you have diabetes):  Drink ONE Gatorade G2 by 04:30 AM the morning of surgery. This bottle was given to you during your hospital pre-op appointment visit.  Nothing else to drink after completing the Small 10 oz bottle of water.         If you have questions, please contact your surgeon's office.     Take these medicines the morning of surgery with A SIP OF WATER:  DULoxetine (CYMBALTA) gabapentin (NEURONTIN) loratadine (CLARITIN)  pantoprazole (PROTONIX) propranolol (INDERAL)  IF NEEDED: acetaminophen (TYLENOL)   As of today, STOP taking any Aspirin (unless otherwise instructed by your surgeon), NSAIDs such as celecoxib (CELEBREX), Aleve, Naproxen, Ibuprofen, Motrin, Advil, Goody's, BC's, all herbal medications, fish oil, and all vitamins.    HOW TO MANAGE YOUR DIABETES BEFORE AND AFTER SURGERY  Why is it important to control my blood sugar before and after surgery? Improving blood sugar levels before and after surgery helps healing and can limit problems. A way of improving blood sugar control is eating a healthy diet by:  Eating less sugar and carbohydrates  Increasing  activity/exercise  Talking with your doctor about reaching your blood sugar goals High blood sugars (greater than 180 mg/dL) can raise your risk of infections and slow your recovery, so you will need to focus on controlling your diabetes during the weeks before surgery. Make sure that the doctor who takes care of your diabetes knows about your planned surgery including the date and location.  How do I manage my blood sugar before surgery? Check your blood sugar at least 4 times a day, starting 2 days before surgery, to make sure that the level is not too high or low.  Check your blood sugar the morning of your surgery when you wake up and every 2 hours until you get to the Short Stay unit.  If your blood sugar is less than 70 mg/dL, you will need to treat for low blood sugar: Do not take insulin. Treat a low blood sugar (less than 70 mg/dL) with  cup of clear juice (cranberry or apple), 4 glucose tablets, OR glucose gel. Recheck blood sugar in 15 minutes after treatment (to make sure it is greater than 70 mg/dL). If your blood sugar is not greater than 70 mg/dL on recheck, call [phone] for further instructions. Report your blood sugar to the short stay nurse when you get to Short Stay.  If you are admitted to the hospital after surgery: Your blood sugar will be checked by the staff and you will probably be given insulin after surgery (instead of oral diabetes medicines) to make sure you have good blood sugar levels. The goal for blood sugar  control after surgery is 80-180 mg/dL.             Special instructions:    [facility]- Preparing For Surgery  Before surgery, you can play an important role. Because skin is not sterile, your skin needs to be as free of germs as possible. You can reduce the number of germs on your skin by washing with CHG (chlorahexidine gluconate) Soap before surgery.  CHG is an antiseptic cleaner which kills germs and bonds with the skin to continue killing  germs even after washing.     Please do not use if you have an allergy to CHG or antibacterial soaps. If your skin becomes reddened/irritated stop using the CHG.  Do not shave (including legs and underarms) for at least 48 hours prior to first CHG shower. It is OK to shave your face.  Please follow these instructions carefully.     Shower the NIGHT BEFORE SURGERY and the MORNING OF SURGERY with CHG Soap.   If you chose to wash your hair, wash your hair first as usual with your normal shampoo. After you shampoo, rinse your hair and body thoroughly to remove the shampoo.  Then [employer] and genitals (private parts) with your normal soap and rinse thoroughly to remove soap.  After that Use CHG Soap as you would any other liquid soap. You can apply CHG directly to the skin and wash gently with a clean washcloth.   Apply the CHG Soap to your body ONLY FROM THE NECK DOWN.  Do not use on open wounds or open sores. Avoid contact with your eyes, ears, mouth and genitals (private parts). Wash Face and genitals (private parts)  with your normal soap.   Wash thoroughly, paying special attention to the area where your surgery will be performed.  Thoroughly rinse your body with warm water from the neck down.  DO NOT shower/wash with your normal soap after using and rinsing off the CHG Soap.  Pat yourself dry with a CLEAN TOWEL.  Wear CLEAN PAJAMAS to bed the night before surgery.  Place CLEAN SHEETS on your bed the night before your surgery.  DO NOT SLEEP WITH PETS.   Day of Surgery:  Take a shower with CHG soap. Wear Clean/Comfortable clothing the morning of surgery Do not wear lotions, powders, perfumes, or deodorant.   Remember to brush your teeth WITH YOUR REGULAR TOOTHPASTE. Do not wear jewelry or makeup. DO Not wear nail polish, gel polish, artificial nails, or any other type of covering on natural nails including finger and toenails. If patients have artificial nails, gel coating,  etc. that need to be removed by a nail salon please have this removed prior to surgery or surgery may need to be canceled/delayed if the surgeon/ anesthesia feels like the patient is unable to be adequately monitored. Do not shave 48 hours prior to surgery.   Do not bring valuables to the hospital. [facility] is not responsible for any belongings or valuables.  Do NOT Smoke (Tobacco/Vaping) or drink Alcohol 24 hours prior to your procedure.  If you use a CPAP at night, you may bring all equipment for your overnight stay.   Contacts, glasses, dentures or bridgework may not be worn into surgery, please bring cases for these belongings.   For patients admitted to the hospital, discharge time will be determined by your treatment team.  Patients discharged the day of surgery will not be allowed to drive home, and someone needs to stay with them  for 24 hours.  NO VISITORS WILL BE ALLOWED IN PRE-OP WHERE PATIENTS GET READY FOR SURGERY.  ONLY 1 SUPPORT PERSON MAY BE PRESENT WHILE YOU ARE IN SURGERY.  IF YOU ARE TO BE ADMITTED, ONCE YOU ARE IN YOUR ROOM YOU WILL BE ALLOWED TWO (2) VISITORS.  Minor children may have two parents present. Special consideration for safety and communication needs will be reviewed on a case by case basis.    Please read over the following fact sheets that you were given.

## 2020-12-16 ENCOUNTER — Encounter (HOSPITAL_COMMUNITY)
Admission: RE | Admit: 2020-12-16 | Discharge: 2020-12-16 | Disposition: A | Discharge status: Home / Self Care | Payer: Medicare Other | Source: Ambulatory Visit | Attending: Specialist | Admitting: Specialist

## 2020-12-16 ENCOUNTER — Other Ambulatory Visit: Payer: Self-pay

## 2020-12-16 ENCOUNTER — Ambulatory Visit (HOSPITAL_COMMUNITY)
Admission: RE | Admit: 2020-12-16 | Discharge: 2020-12-16 | Disposition: A | Discharge status: Home / Self Care | Payer: Medicare Other | Source: Ambulatory Visit | Attending: Orthopedic Surgery | Admitting: Orthopedic Surgery

## 2020-12-16 ENCOUNTER — Encounter (HOSPITAL_COMMUNITY): Payer: Self-pay

## 2020-12-16 DIAGNOSIS — G4733 Obstructive sleep apnea (adult) (pediatric): Secondary | ICD-10-CM | POA: Insufficient documentation

## 2020-12-16 DIAGNOSIS — Z20822 Contact with and (suspected) exposure to covid-19: Secondary | ICD-10-CM | POA: Insufficient documentation

## 2020-12-16 DIAGNOSIS — M5126 Other intervertebral disc displacement, lumbar region: Secondary | ICD-10-CM | POA: Insufficient documentation

## 2020-12-16 DIAGNOSIS — Z79899 Other long term (current) drug therapy: Secondary | ICD-10-CM | POA: Insufficient documentation

## 2020-12-16 DIAGNOSIS — L4050 Arthropathic psoriasis, unspecified: Secondary | ICD-10-CM | POA: Diagnosis not present

## 2020-12-16 DIAGNOSIS — M5136 Other intervertebral disc degeneration, lumbar region: Secondary | ICD-10-CM | POA: Insufficient documentation

## 2020-12-16 DIAGNOSIS — M47819 Spondylosis without myelopathy or radiculopathy, site unspecified: Secondary | ICD-10-CM | POA: Insufficient documentation

## 2020-12-16 DIAGNOSIS — Z01818 Encounter for other preprocedural examination: Secondary | ICD-10-CM | POA: Insufficient documentation

## 2020-12-16 DIAGNOSIS — Z9989 Dependence on other enabling machines and devices: Secondary | ICD-10-CM | POA: Insufficient documentation

## 2020-12-16 DIAGNOSIS — I700 Atherosclerosis of aorta: Secondary | ICD-10-CM | POA: Diagnosis not present

## 2020-12-16 LAB — CBC
HCT: 45.3 % (ref 36.0–46.0)
Hemoglobin: 15.1 g/dL — ABNORMAL HIGH (ref 12.0–15.0)
MCH: 31.5 pg (ref 26.0–34.0)
MCHC: 33.3 g/dL (ref 30.0–36.0)
MCV: 94.4 fL (ref 80.0–100.0)
Platelets: 295 10*3/uL (ref 150–400)
RBC: 4.8 MIL/uL (ref 3.87–5.11)
RDW: 14.9 % (ref 11.5–15.5)
WBC: 7.7 10*3/uL (ref 4.0–10.5)
nRBC: 0 % (ref 0.0–0.2)

## 2020-12-16 LAB — BASIC METABOLIC PANEL
Anion gap: 9 (ref 5–15)
BUN: 14 mg/dL (ref 8–23)
CO2: 24 mmol/L (ref 22–32)
Calcium: 9.1 mg/dL (ref 8.9–10.3)
Chloride: 106 mmol/L (ref 98–111)
Creatinine, Ser: 0.62 mg/dL (ref 0.44–1.00)
GFR, Estimated: >60 mL/min (ref >60)
Glucose, Bld: 127 mg/dL — ABNORMAL HIGH (ref 70–99)
Potassium: 3.8 mmol/L (ref 3.5–5.1)
Sodium: 139 mmol/L (ref 135–145)

## 2020-12-16 LAB — SURGICAL PCR SCREEN
MRSA, PCR: NEGATIVE
Staphylococcus aureus: NEGATIVE

## 2020-12-16 LAB — SARS CORONAVIRUS 2 (TAT 6-24 HRS): SARS Coronavirus 2: NEGATIVE

## 2020-12-16 NOTE — Progress Notes (Signed)
PCP - Dr. Garlon Hatchet Cardiologist - Dr. Deatra Ina- pt states cardiac clearance was given per Dr. Ermelinda Das office. Unable to find in chart.   PPM/ICD - denies   Chest x-ray - 12/13/13 EKG - 02/22/20 Stress Test - 03/04/16 ECHO - Pt states she had one "years ago" Cardiac Cath - 09/30/08  Sleep Study - 2002 CPAP - yes   DM: denies  Blood Thinner Instructions: n/a Aspirin Instructions: pt instructed to hold 11 days prior to surgery. Pt took last dose on 12/08/20  ERAS Protcol - yes, Ensure   COVID TEST- 12/16/20 at PAT appt, results pending   Anesthesia review: yes, cardiac hx  Patient denies shortness of breath, fever, cough and chest pain at PAT appointment   All instructions explained to the patient, with a verbal understanding of the material. Patient agrees to go over the instructions while at home for a better understanding. The opportunity to ask questions was provided.

## 2020-12-17 NOTE — Anesthesia Preprocedure Evaluation (Addendum)
Anesthesia Evaluation  Patient identified by MRN, date of birth, ID band Patient awake    Reviewed: Allergy & Precautions, NPO status , Patient's Chart, lab work & pertinent test results  History of Anesthesia Complications Negative for: history of anesthetic complications  Airway Mallampati: II  TM Distance: >3 FB Neck ROM: Full    Dental  (+) Edentulous Lower, Edentulous Upper   Pulmonary sleep apnea , COPD, former smoker,    Pulmonary exam normal        Cardiovascular hypertension, Pt. on medications + CAD, + Past MI and + Cardiac Stents (2010)  Normal cardiovascular exam  Nuclear stress 2017 low risk   Neuro/Psych Depression Spinal stenosis L1-L5    GI/Hepatic Neg liver ROS, GERD  ,  Endo/Other  negative endocrine ROS  Renal/GU negative Renal ROS  negative genitourinary   Musculoskeletal psoriatic arthritis   Abdominal   Peds  Hematology negative hematology ROS (+)   Anesthesia Other Findings Day of surgery medications reviewed with patient.  Reproductive/Obstetrics negative OB ROS                           Anesthesia Physical Anesthesia Plan  ASA: 3  Anesthesia Plan: General   Post-op Pain Management:    Induction: Intravenous  PONV Risk Score and Plan: 4 or greater and Treatment may vary due to age or medical condition, Ondansetron, Dexamethasone and Midazolam  Airway Management Planned: Oral ETT  Additional Equipment: None  Intra-op Plan:   Post-operative Plan: Extubation in OR  Informed Consent: I have reviewed the patients History and Physical, chart, labs and discussed the procedure including the risks, benefits and alternatives for the proposed anesthesia with the patient or authorized representative who has indicated his/her understanding and acceptance.     Dental advisory given  Plan Discussed with: CRNA  Anesthesia Plan Comments: (PAT note by Antionette Poles,  PA-C: Last seen by PCP Dr. Garlon Hatchet 12/02/2020 for preop evaluation and per note was cleared to proceed with surgery at that time.  Follows with cardiologist Dr. Zena Amos at [facility] of the [facility] for history of CAD s/p MI treated with DES to RCA 2010.  She had negative nuclear stress test in 2017.  Last seen 02/22/2020, stable at that time, advised to continue current medical therapy and follow-up in 1 year. Preop clearance states "proceed at low cardiac risk". Copy of clearance on chart.   Follows with rheumatology at [facility] for history of psoriatic arthritis maintained on methotrexate and Remicade.  OSA on CPAP.  Preop labs reviewed, unremarkable.  EKG 12/16/20: Normal sinus rhythm. Rate 71. Nonspecific ST and T wave abnormality  Nuclear stress 03/04/2016 (care everywhere): 1. Negative ECG response to exercise/vasodilator stress.  2. Normal myocardial perfusion scan. No scintigraphic evidence of ischemia.  3. Normal left ventricular systolic function wall motion.   This represents a low risk study.  )      Anesthesia Quick Evaluation

## 2020-12-17 NOTE — Progress Notes (Signed)
Anesthesia Chart Review:  Last seen by PCP Dr. Garlon Hatchet 12/02/2020 for preop evaluation and per note was cleared to proceed with surgery at that time.  Follows with cardiologist Dr. Zena Amos at [facility] the [facility] for history of CAD s/p MI treated with DES to RCA 2010.  She had negative nuclear stress test in 2017.  Last seen 02/22/2020, stable at that time, advised to continue current medical therapy and follow-up in 1 year. Preop clearance states "proceed at low cardiac risk". Copy of clearance on chart.   Follows with rheumatology at [facility] for history of psoriatic arthritis maintained on methotrexate and Remicade.  OSA on CPAP.  Preop labs reviewed, unremarkable.  EKG 12/16/20: Normal sinus rhythm. Rate 71. Nonspecific ST and T wave abnormality  Nuclear stress 03/04/2016 (care everywhere): 1. Negative ECG response to exercise/vasodilator stress.  2. Normal myocardial perfusion scan. No scintigraphic evidence of ischemia.  3. Normal left ventricular systolic function wall motion.   This represents a low risk study.     Zannie Cove [facility] Short Stay Center/Anesthesiology Phone [phone] 12/17/2020 4:07 PM

## 2020-12-17 NOTE — Progress Notes (Signed)
Patient called to check about her meds for the DOS. She was concerned that she is not supposed to take her ramipril. I told her that she should not take her ramipril the morning of surgery according to our  Anesthesia protocol. She said that she wanted to make sure that is correct and thanked me.

## 2020-12-18 ENCOUNTER — Ambulatory Visit: Payer: Self-pay | Admitting: Orthopedic Surgery

## 2020-12-18 MED ORDER — GENTAMICIN SULFATE 40 MG/ML IJ SOLN
1.5000 mg/kg | INTRAVENOUS | Status: AC
  Administered 2020-12-19: 140 mg via INTRAVENOUS
  Filled 2020-12-18 (×3): qty 3.5

## 2020-12-18 NOTE — H&P (View-Only) (Signed)
Kayla Bailey is an 65 y.o. female.   Chief Complaint: back and leg pain HPI: Reason for Visit: (normal) review of test results (lumbar CT/Myelogram) Location (Lower Extremity): lower back pain ; leg pain on the left, , Severity: pain level 6/10 Aggravating Factors: standing for ; walking for Associated Symptoms: numbness/tingling (left toes); weakness (BLE) Medications: not helping at all; Tylenol prn  Past Medical History:  Diagnosis Date   Anemia    hx of years ago    Arthritis    osteoarthritis, psoriatic arthritis    Barrett's esophagus    CMV (cytomegalovirus infection) (HCC) 2001   COPD (chronic obstructive pulmonary disease) (HCC)    Coronary artery disease    Deaf, right 1992   Depression    Diverticulitis    Foot drop, right foot 2022   Gastroparesis    GERD (gastroesophageal reflux disease)    Hypercholesteremia    Hypertension    Kidney infection    Myocardial infarction (HCC) 09/2008   Peripheral neuropathy    Psoriasis 1983   Seasonal allergies    Sleep apnea    cpap-    Urgency of urination     Past Surgical History:  Procedure Laterality Date   BLADDER SUSPENSION     CHOLECYSTECTOMY     CORONARY ANGIOPLASTY     with multiple Stents   DILATION AND CURETTAGE OF UTERUS  2012   EYE SURGERY Bilateral 2003   cataracts   JOINT REPLACEMENT     right knee - 2011    KNEE ARTHROSCOPY     right knee    NASAL SINUS SURGERY     ROTATOR CUFF REPAIR Right 2017   TOTAL KNEE ARTHROPLASTY Left 12/19/2013   Procedure: LEFT TOTAL KNEE ARTHROPLASTY;  Surgeon: Shelda Pal, MD;  Location: [facility] ORS;  Service: Orthopedics;  Laterality: Left;   TUBAL LIGATION      No family history on file. Social History:  reports that she quit smoking about 3 years ago. Her smoking use included cigarettes. She has a 42.00 pack-year smoking history. She has never used smokeless tobacco. She reports current alcohol use. She reports that she does not use drugs.  Allergies:   Allergies  Allergen Reactions   Codeine Itching and Nausea Only   Golimumab Swelling   Integrilin [Eptifibatide]     Unknown reaction   Keflex [Cephalexin]     Unknown reaction    Lisinopril     Unknown reaction   Penicillins     Unknown reaction    Solifenacin     Other reaction(s): Other (See Comments) Caused blurred vision and "crusty lips"    Current medications: atorvastatin 80 mg tablet BD SafetyGlide Insulin Syringe 1 mL 29 gauge x 1/2" celecoxib 200 mg capsule clobetasoL 0.05 % topical ointment DULoxetine 60 mg capsule,delayed release folic acid 1 mg tablet gabapentin 600 mg tablet metHOTREXate sodium 25 mg/mL injection solution pantoprazole 40 mg tablet,delayed release propranoloL 40 mg tablet propranoloL ER 60 mg capsule,24 hr,extended release ramipriL 10 mg capsule Remicade triamcinolone acetonide 0.1 % topical cream  Review of Systems  Constitutional: Negative.   HENT: Negative.    Eyes: Negative.   Respiratory: Negative.    Cardiovascular: Negative.   Gastrointestinal: Negative.   Endocrine: Negative.   Genitourinary: Negative.   Musculoskeletal:  Positive for back pain and myalgias.  Neurological:  Positive for weakness and numbness.   There were no vitals taken for this visit. Physical Exam Constitutional:      Appearance:  Normal appearance.  HENT:     Head: Normocephalic.     Right Ear: External ear normal.     Left Ear: External ear normal.     Nose: Nose normal.     Mouth/Throat:     Pharynx: Oropharynx is clear.  Eyes:     Conjunctiva/sclera: Conjunctivae normal.  Cardiovascular:     Rate and Rhythm: Normal rate and regular rhythm.     Pulses: Normal pulses.  Pulmonary:     Effort: Pulmonary effort is normal.  Abdominal:     General: Bowel sounds are normal.  Musculoskeletal:     Cervical back: Normal range of motion.     Comments: Patient continues to have the foot drop. She is unable to dorsiflex the foot. She has decreased  sensation near circumferentially of the right leg below the knee. She has weakness in her quadriceps bilaterally. And somewhat in her hip flexors.  Skin:    General: Skin is warm and dry.  Neurological:     Mental Status: She is alert.    CT myelogram of the spine independently reviewed by myself demonstrates advanced multilevel disc degeneration. There is with significant effacement of the CSF at L1-2, L2-3, L3-4 and L4-5. Atherosclerosis is noted as well. Disc protrusion L1-L2.  Patient had MRIs of her cervical spine and her brain without any deficits that are significant noted by her neurologist.  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 No evidence of common femoral vein obstruction  Assessment/Plan Impression:  Patient with neurogenic claudication secondary to severe multilevel spinal stenosis due to multilevel disc degeneration facet and ligamentum flavum hypertrophy currently with a foot drop 3 months duration limited ambulatory capacity. No loss of bowel or bladder function  Currently with a right foot drop utilizing a MAFO  Current Doppler indicating superficial vein chronic thrombosis small saphenous  History of myocardial infarction. Peripheral vascular disease.  Plan:  Given the patient's neurologic deficit neurogenic claudication and evidence of severe spinal stenosis at multiple levels 1 consideration would be a lumbar decompression though this would require probably L1-L5. With laminectomies of L2, L3 and L4.  She has multilevel disc degeneration no instability.  Her comorbidities include peripheral vascular disease.  She is had a history of a DVT on the right leg in the past following knee surgery for which she was treated for aspirin for. She continues on aspirin.  Patient is also had a history of a myocardial infarction.  Indicated I would review these  studies further and discussed these with my colleague Dr. Shon Baton. We discussed lumbar decompression at multiple levels  I had an extensive discussion with the patient concerning the pathology relevant anatomy and treatment options. At this point exhausting conservative treatment and in the presence of a neurologic deficit we discussed microlumbar decompression. I discussed the risks and benefits including bleeding, infection, DVT, PE, anesthetic complications, worsening in their symptoms, improvement in their symptoms, C SF leakage, epidural fibrosis, need for future surgeries such as revision discectomy and lumbar fusion. I also indicated that this is an operation to basically decompress the nerve root to allow recovery as opposed to fixing a herniated disc if is encountered and that the incidence of recurrent chest disc herniation can approach 15%. Also that nerve root recovery is variable and may not recover completely.  I discussed the operative course including overnight in the hospital. Immediate ambulation. Follow-up in 2  weeks for suture removal. 6 weeks until healing of the herniation followed by 6 weeks of reconditioning and strengthening of the core musculature. Also discussed the need to employ the concepts of disc pressure management and core motion following the surgery to minimize the risk of recurrent disc herniation. We will obtain preoperative clearance i if necessary and proceed accordingly.  Will need preoperative clearance from her cardiologist as well as her PCP she would have to be off aspirin 11 days prior and for 5 to 7 days following that.  Plan  microlumbar decompression L1-L5  Dorothy Spark, PA-C for Dr Shelle Iron 12/18/2020, 4:19 PM

## 2020-12-18 NOTE — H&P (Signed)
Kayla Bailey is an 65 y.o. female.   Chief Complaint: back and leg pain HPI: Reason for Visit: (normal) review of test results (lumbar CT/Myelogram) Location (Lower Extremity): lower back pain ; leg pain on the left, , Severity: pain level 6/10 Aggravating Factors: standing for ; walking for Associated Symptoms: numbness/tingling (left toes); weakness (BLE) Medications: not helping at all; Tylenol prn  Past Medical History:  Diagnosis Date   Anemia    hx of years ago    Arthritis    osteoarthritis, psoriatic arthritis    Barrett's esophagus    CMV (cytomegalovirus infection) (HCC) 2001   COPD (chronic obstructive pulmonary disease) (HCC)    Coronary artery disease    Deaf, right 1992   Depression    Diverticulitis    Foot drop, right foot 2022   Gastroparesis    GERD (gastroesophageal reflux disease)    Hypercholesteremia    Hypertension    Kidney infection    Myocardial infarction (HCC) 09/2008   Peripheral neuropathy    Psoriasis 1983   Seasonal allergies    Sleep apnea    cpap-    Urgency of urination     Past Surgical History:  Procedure Laterality Date   BLADDER SUSPENSION     CHOLECYSTECTOMY     CORONARY ANGIOPLASTY     with multiple Stents   DILATION AND CURETTAGE OF UTERUS  2012   EYE SURGERY Bilateral 2003   cataracts   JOINT REPLACEMENT     right knee - 2011    KNEE ARTHROSCOPY     right knee    NASAL SINUS SURGERY     ROTATOR CUFF REPAIR Right 2017   TOTAL KNEE ARTHROPLASTY Left 12/19/2013   Procedure: LEFT TOTAL KNEE ARTHROPLASTY;  Surgeon: Matthew D Olin, MD;  Location: [facility];  Service: Orthopedics;  Laterality: Left;   TUBAL LIGATION      No family history on file. Social History:  reports that she quit smoking about 3 years ago. Her smoking use included cigarettes. She has a 42.00 pack-year smoking history. She has never used smokeless tobacco. She reports current alcohol use. She reports that she does not use drugs.  Allergies:   Allergies  Allergen Reactions   Codeine Itching and Nausea Only   Golimumab Swelling   Integrilin [Eptifibatide]     Unknown reaction   Keflex [Cephalexin]     Unknown reaction    Lisinopril     Unknown reaction   Penicillins     Unknown reaction    Solifenacin     Other reaction(s): Other (See Comments) Caused blurred vision and "crusty lips"    Current medications: atorvastatin 80 mg tablet BD SafetyGlide Insulin Syringe 1 mL 29 gauge x 1/2" celecoxib 200 mg capsule clobetasoL 0.05 % topical ointment DULoxetine 60 mg capsule,delayed release folic acid 1 mg tablet gabapentin 600 mg tablet metHOTREXate sodium 25 mg/mL injection solution pantoprazole 40 mg tablet,delayed release propranoloL 40 mg tablet propranoloL ER 60 mg capsule,24 hr,extended release ramipriL 10 mg capsule Remicade triamcinolone acetonide 0.1 % topical cream  Review of Systems  Constitutional: Negative.   HENT: Negative.    Eyes: Negative.   Respiratory: Negative.    Cardiovascular: Negative.   Gastrointestinal: Negative.   Endocrine: Negative.   Genitourinary: Negative.   Musculoskeletal:  Positive for back pain and myalgias.  Neurological:  Positive for weakness and numbness.   There were no vitals taken for this visit. Physical Exam Constitutional:      Appearance:   Normal appearance.  HENT:     Head: Normocephalic.     Right Ear: External ear normal.     Left Ear: External ear normal.     Nose: Nose normal.     Mouth/Throat:     Pharynx: Oropharynx is clear.  Eyes:     Conjunctiva/sclera: Conjunctivae normal.  Cardiovascular:     Rate and Rhythm: Normal rate and regular rhythm.     Pulses: Normal pulses.  Pulmonary:     Effort: Pulmonary effort is normal.  Abdominal:     General: Bowel sounds are normal.  Musculoskeletal:     Cervical back: Normal range of motion.     Comments: Patient continues to have the foot drop. She is unable to dorsiflex the foot. She has decreased  sensation near circumferentially of the right leg below the knee. She has weakness in her quadriceps bilaterally. And somewhat in her hip flexors.  Skin:    General: Skin is warm and dry.  Neurological:     Mental Status: She is alert.    CT myelogram of the spine independently reviewed by myself demonstrates advanced multilevel disc degeneration. There is with significant effacement of the CSF at L1-2, L2-3, L3-4 and L4-5. Atherosclerosis is noted as well. Disc protrusion L1-L2.  Patient had MRIs of her cervical spine and her brain without any deficits that are significant noted by her neurologist.  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 No evidence of common femoral vein obstruction  Assessment/Plan Impression:  Patient with neurogenic claudication secondary to severe multilevel spinal stenosis due to multilevel disc degeneration facet and ligamentum flavum hypertrophy currently with a foot drop 3 months duration limited ambulatory capacity. No loss of bowel or bladder function  Currently with a right foot drop utilizing a MAFO  Current Doppler indicating superficial vein chronic thrombosis small saphenous  History of myocardial infarction. Peripheral vascular disease.  Plan:  Given the patient's neurologic deficit neurogenic claudication and evidence of severe spinal stenosis at multiple levels 1 consideration would be a lumbar decompression though this would require probably L1-L5. With laminectomies of L2, L3 and L4.  She has multilevel disc degeneration no instability.  Her comorbidities include peripheral vascular disease.  She is had a history of a DVT on the right leg in the past following knee surgery for which she was treated for aspirin for. She continues on aspirin.  Patient is also had a history of a myocardial infarction.  Indicated I would review these  studies further and discussed these with my colleague Dr. Shon Baton. We discussed lumbar decompression at multiple levels  I had an extensive discussion with the patient concerning the pathology relevant anatomy and treatment options. At this point exhausting conservative treatment and in the presence of a neurologic deficit we discussed microlumbar decompression. I discussed the risks and benefits including bleeding, infection, DVT, PE, anesthetic complications, worsening in their symptoms, improvement in their symptoms, C SF leakage, epidural fibrosis, need for future surgeries such as revision discectomy and lumbar fusion. I also indicated that this is an operation to basically decompress the nerve root to allow recovery as opposed to fixing a herniated disc if is encountered and that the incidence of recurrent chest disc herniation can approach 15%. Also that nerve root recovery is variable and may not recover completely.  I discussed the operative course including overnight in the hospital. Immediate ambulation. Follow-up in 2  weeks for suture removal. 6 weeks until healing of the herniation followed by 6 weeks of reconditioning and strengthening of the core musculature. Also discussed the need to employ the concepts of disc pressure management and core motion following the surgery to minimize the risk of recurrent disc herniation. We will obtain preoperative clearance i if necessary and proceed accordingly.  Will need preoperative clearance from her cardiologist as well as her PCP she would have to be off aspirin 11 days prior and for 5 to 7 days following that.  Plan  microlumbar decompression L1-L5  Dagan Heinz M Zadok Holaway, PA-C for Dr Beane 12/18/2020, 4:19 PM    

## 2020-12-19 ENCOUNTER — Ambulatory Visit (HOSPITAL_COMMUNITY): Payer: Medicare Other | Admitting: Physician Assistant

## 2020-12-19 ENCOUNTER — Ambulatory Visit (HOSPITAL_COMMUNITY)
Admission: RE | Admit: 2020-12-19 | Discharge: 2020-12-20 | Disposition: A | Discharge status: Home / Self Care | Payer: Medicare Other | Attending: Specialist | Admitting: Specialist

## 2020-12-19 ENCOUNTER — Ambulatory Visit (HOSPITAL_COMMUNITY): Payer: Medicare Other | Admitting: Anesthesiology

## 2020-12-19 ENCOUNTER — Other Ambulatory Visit: Payer: Self-pay

## 2020-12-19 ENCOUNTER — Ambulatory Visit (HOSPITAL_COMMUNITY): Payer: Medicare Other

## 2020-12-19 ENCOUNTER — Encounter (HOSPITAL_COMMUNITY)
Admission: RE | Disposition: A | Discharge status: Home / Self Care | Payer: Self-pay | Source: Home / Self Care | Attending: Specialist

## 2020-12-19 ENCOUNTER — Encounter (HOSPITAL_COMMUNITY): Payer: Self-pay | Admitting: Specialist

## 2020-12-19 DIAGNOSIS — M48061 Spinal stenosis, lumbar region without neurogenic claudication: Secondary | ICD-10-CM | POA: Diagnosis present

## 2020-12-19 DIAGNOSIS — Z881 Allergy status to other antibiotic agents status: Secondary | ICD-10-CM | POA: Insufficient documentation

## 2020-12-19 DIAGNOSIS — Z86718 Personal history of other venous thrombosis and embolism: Secondary | ICD-10-CM | POA: Diagnosis not present

## 2020-12-19 DIAGNOSIS — Z96652 Presence of left artificial knee joint: Secondary | ICD-10-CM | POA: Diagnosis not present

## 2020-12-19 DIAGNOSIS — Z885 Allergy status to narcotic agent status: Secondary | ICD-10-CM | POA: Insufficient documentation

## 2020-12-19 DIAGNOSIS — Z87891 Personal history of nicotine dependence: Secondary | ICD-10-CM | POA: Insufficient documentation

## 2020-12-19 DIAGNOSIS — M21371 Foot drop, right foot: Secondary | ICD-10-CM | POA: Insufficient documentation

## 2020-12-19 DIAGNOSIS — I739 Peripheral vascular disease, unspecified: Secondary | ICD-10-CM | POA: Insufficient documentation

## 2020-12-19 DIAGNOSIS — M48062 Spinal stenosis, lumbar region with neurogenic claudication: Secondary | ICD-10-CM | POA: Insufficient documentation

## 2020-12-19 DIAGNOSIS — Z880 Allergy status to penicillin: Secondary | ICD-10-CM | POA: Insufficient documentation

## 2020-12-19 DIAGNOSIS — Z79899 Other long term (current) drug therapy: Secondary | ICD-10-CM | POA: Diagnosis not present

## 2020-12-19 DIAGNOSIS — M5136 Other intervertebral disc degeneration, lumbar region: Secondary | ICD-10-CM | POA: Insufficient documentation

## 2020-12-19 DIAGNOSIS — I252 Old myocardial infarction: Secondary | ICD-10-CM | POA: Diagnosis not present

## 2020-12-19 DIAGNOSIS — Z888 Allergy status to other drugs, medicaments and biological substances status: Secondary | ICD-10-CM | POA: Diagnosis not present

## 2020-12-19 DIAGNOSIS — M5137 Other intervertebral disc degeneration, lumbosacral region: Secondary | ICD-10-CM | POA: Insufficient documentation

## 2020-12-19 DIAGNOSIS — Z419 Encounter for procedure for purposes other than remedying health state, unspecified: Secondary | ICD-10-CM

## 2020-12-19 HISTORY — PX: LUMBAR LAMINECTOMY/DECOMPRESSION MICRODISCECTOMY: SHX5026

## 2020-12-19 SURGERY — LUMBAR LAMINECTOMY/DECOMPRESSION MICRODISCECTOMY 4 LEVEL
Anesthesia: General

## 2020-12-19 MED ORDER — VANCOMYCIN HCL IN DEXTROSE 1-5 GM/200ML-% IV SOLN
1000.0000 mg | INTRAVENOUS | Status: AC
  Administered 2020-12-19: 1000 mg via INTRAVENOUS
  Filled 2020-12-19: qty 200

## 2020-12-19 MED ORDER — FENTANYL CITRATE (PF) 250 MCG/5ML IJ SOLN
INTRAMUSCULAR | Status: AC
  Filled 2020-12-19: qty 5

## 2020-12-19 MED ORDER — MIDAZOLAM HCL 5 MG/5ML IJ SOLN
INTRAMUSCULAR | Status: DC | PRN
  Administered 2020-12-19: 2 mg via INTRAVENOUS

## 2020-12-19 MED ORDER — LACTATED RINGERS IV SOLN: INTRAVENOUS | Status: DC

## 2020-12-19 MED ORDER — VANCOMYCIN HCL 1250 MG/250ML IV SOLN
1250.0000 mg | Freq: Two times a day (BID) | INTRAVENOUS | Status: DC
  Administered 2020-12-19: 1250 mg via INTRAVENOUS
  Filled 2020-12-19 (×3): qty 250

## 2020-12-19 MED ORDER — DOCUSATE SODIUM 100 MG PO CAPS
100.0000 mg | ORAL_CAPSULE | Freq: Two times a day (BID) | ORAL | Status: DC
  Administered 2020-12-19 – 2020-12-20 (×3): 100 mg via ORAL
  Filled 2020-12-19 (×3): qty 1

## 2020-12-19 MED ORDER — PROPRANOLOL HCL 40 MG PO TABS
40.0000 mg | ORAL_TABLET | Freq: Two times a day (BID) | ORAL | Status: DC
  Administered 2020-12-19 – 2020-12-20 (×2): 40 mg via ORAL
  Filled 2020-12-19 (×3): qty 1

## 2020-12-19 MED ORDER — CHLORHEXIDINE GLUCONATE 0.12 % MT SOLN
15.0000 mL | Freq: Once | OROMUCOSAL | Status: AC
  Administered 2020-12-19: 15 mL via OROMUCOSAL
  Filled 2020-12-19: qty 15

## 2020-12-19 MED ORDER — DULOXETINE HCL 30 MG PO CPEP
60.0000 mg | ORAL_CAPSULE | Freq: Two times a day (BID) | ORAL | Status: DC
  Administered 2020-12-19 – 2020-12-20 (×2): 60 mg via ORAL
  Filled 2020-12-19 (×2): qty 2

## 2020-12-19 MED ORDER — 0.9 % SODIUM CHLORIDE (POUR BTL) OPTIME
TOPICAL | Status: DC | PRN
  Administered 2020-12-19: 1000 mL

## 2020-12-19 MED ORDER — ACETAMINOPHEN 650 MG RE SUPP: 650.0000 mg | RECTAL | Status: DC | PRN

## 2020-12-19 MED ORDER — RAMIPRIL 5 MG PO CAPS
10.0000 mg | ORAL_CAPSULE | Freq: Every day | ORAL | Status: DC
  Administered 2020-12-20: 10 mg via ORAL
  Filled 2020-12-19: qty 2

## 2020-12-19 MED ORDER — ONDANSETRON HCL 4 MG/2ML IJ SOLN
INTRAMUSCULAR | Status: AC
  Filled 2020-12-19: qty 2

## 2020-12-19 MED ORDER — ORAL CARE MOUTH RINSE: 15.0000 mL | Freq: Once | OROMUCOSAL | Status: AC

## 2020-12-19 MED ORDER — DOCUSATE SODIUM 100 MG PO CAPS: 100.0000 mg | ORAL_CAPSULE | Freq: Two times a day (BID) | ORAL | 1 refills | Status: DC | PRN

## 2020-12-19 MED ORDER — ROCURONIUM BROMIDE 100 MG/10ML IV SOLN
INTRAVENOUS | Status: DC | PRN
  Administered 2020-12-19: 60 mg via INTRAVENOUS
  Administered 2020-12-19: 40 mg via INTRAVENOUS

## 2020-12-19 MED ORDER — ONDANSETRON HCL 4 MG/2ML IJ SOLN: 4.0000 mg | Freq: Four times a day (QID) | INTRAMUSCULAR | Status: DC | PRN

## 2020-12-19 MED ORDER — ALUM & MAG HYDROXIDE-SIMETH 200-200-20 MG/5ML PO SUSP: 30.0000 mL | Freq: Four times a day (QID) | ORAL | Status: DC | PRN

## 2020-12-19 MED ORDER — PROMETHAZINE HCL 25 MG/ML IJ SOLN: 6.2500 mg | INTRAMUSCULAR | Status: DC | PRN

## 2020-12-19 MED ORDER — ROCURONIUM BROMIDE 10 MG/ML (PF) SYRINGE
PREFILLED_SYRINGE | INTRAVENOUS | Status: AC
  Filled 2020-12-19: qty 10

## 2020-12-19 MED ORDER — POLYETHYLENE GLYCOL 3350 17 G PO PACK: 17.0000 g | PACK | Freq: Every day | ORAL | 0 refills | Status: DC

## 2020-12-19 MED ORDER — POLYETHYLENE GLYCOL 3350 17 G PO PACK: 17.0000 g | PACK | Freq: Every day | ORAL | Status: DC | PRN

## 2020-12-19 MED ORDER — FOLIC ACID 1 MG PO TABS
1.0000 mg | ORAL_TABLET | Freq: Every day | ORAL | Status: DC
  Administered 2020-12-19: 1 mg via ORAL
  Filled 2020-12-19: qty 1

## 2020-12-19 MED ORDER — ALBUTEROL SULFATE HFA 108 (90 BASE) MCG/ACT IN AERS
INHALATION_SPRAY | RESPIRATORY_TRACT | Status: DC | PRN
  Administered 2020-12-19: 12 via RESPIRATORY_TRACT

## 2020-12-19 MED ORDER — BUPIVACAINE-EPINEPHRINE 0.5% -1:200000 IJ SOLN
INTRAMUSCULAR | Status: DC | PRN
  Administered 2020-12-19: 7 mL

## 2020-12-19 MED ORDER — ALBUTEROL SULFATE HFA 108 (90 BASE) MCG/ACT IN AERS
INHALATION_SPRAY | RESPIRATORY_TRACT | Status: AC
  Filled 2020-12-19: qty 6.7

## 2020-12-19 MED ORDER — DEXAMETHASONE SODIUM PHOSPHATE 4 MG/ML IJ SOLN
INTRAMUSCULAR | Status: DC | PRN
  Administered 2020-12-19: 10 mg via INTRAVENOUS

## 2020-12-19 MED ORDER — PANTOPRAZOLE SODIUM 40 MG PO TBEC
40.0000 mg | DELAYED_RELEASE_TABLET | Freq: Every morning | ORAL | Status: DC
  Administered 2020-12-20: 40 mg via ORAL
  Filled 2020-12-19: qty 1

## 2020-12-19 MED ORDER — BUPIVACAINE-EPINEPHRINE 0.5% -1:200000 IJ SOLN
INTRAMUSCULAR | Status: AC
  Filled 2020-12-19: qty 1

## 2020-12-19 MED ORDER — GABAPENTIN 600 MG PO TABS: 1200.0000 mg | ORAL_TABLET | Freq: Two times a day (BID) | ORAL | Status: DC

## 2020-12-19 MED ORDER — FENTANYL CITRATE (PF) 100 MCG/2ML IJ SOLN
INTRAMUSCULAR | Status: DC | PRN
  Administered 2020-12-19 (×2): 25 ug via INTRAVENOUS
  Administered 2020-12-19 (×2): 50 ug via INTRAVENOUS
  Administered 2020-12-19: 100 ug via INTRAVENOUS
  Administered 2020-12-19 (×3): 50 ug via INTRAVENOUS

## 2020-12-19 MED ORDER — METHOCARBAMOL 500 MG PO TABS: 500.0000 mg | ORAL_TABLET | Freq: Three times a day (TID) | ORAL | 1 refills | Status: DC | PRN

## 2020-12-19 MED ORDER — BISACODYL 5 MG PO TBEC: 5.0000 mg | DELAYED_RELEASE_TABLET | Freq: Every day | ORAL | Status: DC | PRN

## 2020-12-19 MED ORDER — TRIAMCINOLONE ACETONIDE 0.1 % EX CREA
1.0000 "application " | TOPICAL_CREAM | Freq: Two times a day (BID) | CUTANEOUS | Status: DC | PRN
  Filled 2020-12-19: qty 15

## 2020-12-19 MED ORDER — LIDOCAINE 2% (20 MG/ML) 5 ML SYRINGE
INTRAMUSCULAR | Status: DC | PRN
  Administered 2020-12-19: 60 mg via INTRAVENOUS

## 2020-12-19 MED ORDER — VITAMIN B-12 1000 MCG PO TABS
1000.0000 ug | ORAL_TABLET | Freq: Every day | ORAL | Status: DC
  Administered 2020-12-19 – 2020-12-20 (×2): 1000 ug via ORAL
  Filled 2020-12-19 (×2): qty 1

## 2020-12-19 MED ORDER — ACETAMINOPHEN 10 MG/ML IV SOLN
INTRAVENOUS | Status: AC
  Filled 2020-12-19: qty 100

## 2020-12-19 MED ORDER — OXYCODONE HCL 5 MG PO TABS: 5.0000 mg | ORAL_TABLET | ORAL | 0 refills | Status: DC | PRN

## 2020-12-19 MED ORDER — METHOCARBAMOL 1000 MG/10ML IJ SOLN
500.0000 mg | Freq: Four times a day (QID) | INTRAVENOUS | Status: DC | PRN
  Filled 2020-12-19: qty 5

## 2020-12-19 MED ORDER — ONDANSETRON HCL 4 MG/2ML IJ SOLN
INTRAMUSCULAR | Status: DC | PRN
  Administered 2020-12-19: 4 mg via INTRAVENOUS

## 2020-12-19 MED ORDER — GABAPENTIN 600 MG PO TABS
1200.0000 mg | ORAL_TABLET | Freq: Two times a day (BID) | ORAL | Status: DC
  Administered 2020-12-19 – 2020-12-20 (×2): 1200 mg via ORAL
  Filled 2020-12-19 (×2): qty 2

## 2020-12-19 MED ORDER — EPHEDRINE SULFATE-NACL 50-0.9 MG/10ML-% IV SOSY
PREFILLED_SYRINGE | INTRAVENOUS | Status: DC | PRN
  Administered 2020-12-19 (×2): 5 mg via INTRAVENOUS
  Administered 2020-12-19: 10 mg via INTRAVENOUS
  Administered 2020-12-19: 5 mg via INTRAVENOUS

## 2020-12-19 MED ORDER — OXYCODONE HCL 5 MG PO TABS: 5.0000 mg | ORAL_TABLET | ORAL | Status: DC | PRN

## 2020-12-19 MED ORDER — HYDROMORPHONE HCL 1 MG/ML IJ SOLN: 0.5000 mg | INTRAMUSCULAR | Status: DC | PRN

## 2020-12-19 MED ORDER — THROMBIN 20000 UNITS EX SOLR
CUTANEOUS | Status: DC | PRN
  Administered 2020-12-19: 20 mL via TOPICAL

## 2020-12-19 MED ORDER — HYDROMORPHONE HCL 1 MG/ML IJ SOLN
INTRAMUSCULAR | Status: AC
  Filled 2020-12-19: qty 1

## 2020-12-19 MED ORDER — METHOCARBAMOL 500 MG PO TABS
500.0000 mg | ORAL_TABLET | Freq: Four times a day (QID) | ORAL | Status: DC | PRN
  Administered 2020-12-19 – 2020-12-20 (×2): 500 mg via ORAL
  Filled 2020-12-19 (×2): qty 1

## 2020-12-19 MED ORDER — TRANEXAMIC ACID-NACL 1000-0.7 MG/100ML-% IV SOLN
1000.0000 mg | INTRAVENOUS | Status: AC
  Administered 2020-12-19: 1000 mg via INTRAVENOUS
  Filled 2020-12-19: qty 100

## 2020-12-19 MED ORDER — ACETAMINOPHEN 500 MG PO TABS: 1000.0000 mg | ORAL_TABLET | Freq: Once | ORAL | Status: DC

## 2020-12-19 MED ORDER — OXYCODONE HCL 5 MG PO TABS
10.0000 mg | ORAL_TABLET | ORAL | Status: DC | PRN
  Administered 2020-12-19 – 2020-12-20 (×5): 10 mg via ORAL
  Filled 2020-12-19 (×5): qty 2

## 2020-12-19 MED ORDER — PHENYLEPHRINE HCL-NACL 20-0.9 MG/250ML-% IV SOLN
INTRAVENOUS | Status: DC | PRN
  Administered 2020-12-19: 30 ug/min via INTRAVENOUS

## 2020-12-19 MED ORDER — HYDROMORPHONE HCL 1 MG/ML IJ SOLN
0.2500 mg | INTRAMUSCULAR | Status: DC | PRN
  Administered 2020-12-19 (×3): 0.5 mg via INTRAVENOUS

## 2020-12-19 MED ORDER — MIDAZOLAM HCL 2 MG/2ML IJ SOLN
INTRAMUSCULAR | Status: AC
  Filled 2020-12-19: qty 2

## 2020-12-19 MED ORDER — MENTHOL 3 MG MT LOZG: 1.0000 | LOZENGE | OROMUCOSAL | Status: DC | PRN

## 2020-12-19 MED ORDER — KCL IN DEXTROSE-NACL 20-5-0.45 MEQ/L-%-% IV SOLN: INTRAVENOUS | Status: DC

## 2020-12-19 MED ORDER — PHENYLEPHRINE 40 MCG/ML (10ML) SYRINGE FOR IV PUSH (FOR BLOOD PRESSURE SUPPORT)
PREFILLED_SYRINGE | INTRAVENOUS | Status: DC | PRN
  Administered 2020-12-19: 80 ug via INTRAVENOUS

## 2020-12-19 MED ORDER — ONDANSETRON HCL 4 MG PO TABS: 4.0000 mg | ORAL_TABLET | Freq: Four times a day (QID) | ORAL | Status: DC | PRN

## 2020-12-19 MED ORDER — SUGAMMADEX SODIUM 200 MG/2ML IV SOLN
INTRAVENOUS | Status: DC | PRN
  Administered 2020-12-19: 200 mg via INTRAVENOUS

## 2020-12-19 MED ORDER — ASCORBIC ACID 500 MG PO TABS
500.0000 mg | ORAL_TABLET | Freq: Every day | ORAL | Status: DC
  Administered 2020-12-19 – 2020-12-20 (×2): 500 mg via ORAL
  Filled 2020-12-19 (×2): qty 1

## 2020-12-19 MED ORDER — ACETAMINOPHEN 10 MG/ML IV SOLN
1000.0000 mg | INTRAVENOUS | Status: AC
  Administered 2020-12-19: 1000 mg via INTRAVENOUS
  Filled 2020-12-19: qty 100

## 2020-12-19 MED ORDER — LIDOCAINE HCL (PF) 2 % IJ SOLN
INTRAMUSCULAR | Status: AC
  Filled 2020-12-19: qty 5

## 2020-12-19 MED ORDER — PROPOFOL 10 MG/ML IV BOLUS
INTRAVENOUS | Status: DC | PRN
  Administered 2020-12-19: 20 mg via INTRAVENOUS
  Administered 2020-12-19: 100 mg via INTRAVENOUS

## 2020-12-19 MED ORDER — LORATADINE 10 MG PO TABS
10.0000 mg | ORAL_TABLET | Freq: Every day | ORAL | Status: DC
  Administered 2020-12-20: 10 mg via ORAL
  Filled 2020-12-19: qty 1

## 2020-12-19 MED ORDER — ACETAMINOPHEN 10 MG/ML IV SOLN
1000.0000 mg | Freq: Once | INTRAVENOUS | Status: DC | PRN
  Administered 2020-12-19: 1000 mg via INTRAVENOUS

## 2020-12-19 MED ORDER — PHENOL 1.4 % MT LIQD: 1.0000 | OROMUCOSAL | Status: DC | PRN

## 2020-12-19 MED ORDER — ACETAMINOPHEN 325 MG PO TABS
650.0000 mg | ORAL_TABLET | ORAL | Status: DC | PRN
  Administered 2020-12-19 – 2020-12-20 (×2): 650 mg via ORAL
  Filled 2020-12-19 (×2): qty 2

## 2020-12-19 MED ORDER — DEXAMETHASONE SODIUM PHOSPHATE 10 MG/ML IJ SOLN
INTRAMUSCULAR | Status: AC
  Filled 2020-12-19: qty 1

## 2020-12-19 MED ORDER — EPHEDRINE 5 MG/ML INJ
INTRAVENOUS | Status: AC
  Filled 2020-12-19: qty 5

## 2020-12-19 MED ORDER — THROMBIN 20000 UNITS EX SOLR
CUTANEOUS | Status: AC
  Filled 2020-12-19: qty 20000

## 2020-12-19 MED ORDER — PHENYLEPHRINE 40 MCG/ML (10ML) SYRINGE FOR IV PUSH (FOR BLOOD PRESSURE SUPPORT)
PREFILLED_SYRINGE | INTRAVENOUS | Status: AC
  Filled 2020-12-19: qty 10

## 2020-12-19 SURGICAL SUPPLY — 60 items
BAG COUNTER SPONGE SURGICOUNT (BAG) ×2 IMPLANT
BAG DECANTER FOR FLEXI CONT (MISCELLANEOUS) ×2 IMPLANT
BAND RUBBER #18 3X1/16 STRL (MISCELLANEOUS) ×4 IMPLANT
BIOPATCH RED 1 DISK 7.0 (GAUZE/BANDAGES/DRESSINGS) ×2 IMPLANT
BUR EGG ELITE 5.0 (BURR) ×2 IMPLANT
BUR RND DIAMOND ELITE 4.0 (BURR) ×2 IMPLANT
CNTNR URN SCR LID CUP LEK RST (MISCELLANEOUS) ×1 IMPLANT
CONT SPEC 4OZ STRL OR WHT (MISCELLANEOUS) ×1
DRAPE LAPAROTOMY 100X72X124 (DRAPES) ×2 IMPLANT
DRAPE MICROSCOPE LEICA (MISCELLANEOUS) ×2 IMPLANT
DRAPE SHEET LG 3/4 BI-LAMINATE (DRAPES) ×2 IMPLANT
DRAPE SURG 17X11 SM STRL (DRAPES) ×2 IMPLANT
DRAPE UTILITY XL STRL (DRAPES) ×2 IMPLANT
DRSG AQUACEL AG ADV 3.5X 4 (GAUZE/BANDAGES/DRESSINGS) IMPLANT
DRSG AQUACEL AG ADV 3.5X 6 (GAUZE/BANDAGES/DRESSINGS) ×2 IMPLANT
DRSG TEGADERM 4X4.75 (GAUZE/BANDAGES/DRESSINGS) ×2 IMPLANT
DRSG TELFA 3X8 NADH (GAUZE/BANDAGES/DRESSINGS) IMPLANT
DURAPREP 26ML APPLICATOR (WOUND CARE) ×2 IMPLANT
DURASEAL SPINE SEALANT 3ML (MISCELLANEOUS) IMPLANT
ELECT BLADE 4.0 EZ CLEAN MEGAD (MISCELLANEOUS)
ELECT REM PT RETURN 9FT ADLT (ELECTROSURGICAL) ×2
ELECTRODE BLDE 4.0 EZ CLN MEGD (MISCELLANEOUS) IMPLANT
ELECTRODE REM PT RTRN 9FT ADLT (ELECTROSURGICAL) ×1 IMPLANT
EVACUATOR 1/8 PVC DRAIN (DRAIN) ×2 IMPLANT
GLOVE SURG POLYISO LF SZ7.5 (GLOVE) ×2 IMPLANT
GLOVE SURG POLYISO LF SZ8 (GLOVE) ×6 IMPLANT
GLOVE SURG UNDER POLY LF SZ7 (GLOVE) ×6 IMPLANT
GOWN STRL REUS W/ TWL LRG LVL3 (GOWN DISPOSABLE) ×1 IMPLANT
GOWN STRL REUS W/ TWL XL LVL3 (GOWN DISPOSABLE) ×1 IMPLANT
GOWN STRL REUS W/TWL LRG LVL3 (GOWN DISPOSABLE) ×1
GOWN STRL REUS W/TWL XL LVL3 (GOWN DISPOSABLE) ×1
IV CATH 14GX2 1/4 (CATHETERS) ×2 IMPLANT
KIT BASIN OR (CUSTOM PROCEDURE TRAY) ×2 IMPLANT
KIT POSITION SURG JACKSON T1 (MISCELLANEOUS) IMPLANT
NEEDLE 22X1 1/2 (OR ONLY) (NEEDLE) ×2 IMPLANT
NEEDLE SPNL 18GX3.5 QUINCKE PK (NEEDLE) ×4 IMPLANT
PACK LAMINECTOMY NEURO (CUSTOM PROCEDURE TRAY) ×2 IMPLANT
PATTIES SURGICAL .25X.25 (GAUZE/BANDAGES/DRESSINGS) ×2 IMPLANT
PATTIES SURGICAL .5 X.5 (GAUZE/BANDAGES/DRESSINGS) ×2 IMPLANT
PATTIES SURGICAL .75X.75 (GAUZE/BANDAGES/DRESSINGS) ×2 IMPLANT
SPONGE SURGIFOAM ABS GEL 100 (HEMOSTASIS) ×2 IMPLANT
SPONGE T-LAP 4X18 ~~LOC~~+RFID (SPONGE) ×8 IMPLANT
STAPLER VISISTAT (STAPLE) ×2 IMPLANT
STAPLER VISISTAT 35W (STAPLE) IMPLANT
STRIP CLOSURE SKIN 1/2X4 (GAUZE/BANDAGES/DRESSINGS) ×2 IMPLANT
SUT NURALON 4 0 TR CR/8 (SUTURE) IMPLANT
SUT PROLENE 3 0 PS 2 (SUTURE) ×2 IMPLANT
SUT VIC AB 1 CT1 18XBRD ANBCTR (SUTURE) ×1 IMPLANT
SUT VIC AB 1 CT1 27 (SUTURE)
SUT VIC AB 1 CT1 27XBRD ANTBC (SUTURE) IMPLANT
SUT VIC AB 1 CT1 8-18 (SUTURE) ×1
SUT VIC AB 1-0 CT2 27 (SUTURE) ×2 IMPLANT
SUT VIC AB 2-0 CT1 27 (SUTURE) ×1
SUT VIC AB 2-0 CT1 TAPERPNT 27 (SUTURE) ×1 IMPLANT
SUT VIC AB 2-0 CT2 27 (SUTURE) ×2 IMPLANT
SYR 3ML LL SCALE MARK (SYRINGE) ×2 IMPLANT
TOWEL GREEN STERILE (TOWEL DISPOSABLE) ×2 IMPLANT
TOWEL GREEN STERILE FF (TOWEL DISPOSABLE) ×2 IMPLANT
TRAY FOLEY MTR SLVR 16FR STAT (SET/KITS/TRAYS/PACK) ×2 IMPLANT
YANKAUER SUCT BULB TIP NO VENT (SUCTIONS) ×2 IMPLANT

## 2020-12-19 NOTE — Interval H&P Note (Signed)
History and Physical Interval Note:  12/19/2020 7:20 AM  Kayla Bailey  has presented today for surgery, with the diagnosis of Spinal stenosis L1-L5.  The various methods of treatment have been discussed with the patient and family. After consideration of risks, benefits and other options for treatment, the patient has consented to  Procedure(s): Microlumbar decompression L1-2, L2-3, L3-4, L4-5 (N/A) as a surgical intervention.  The patient's history has been reviewed, patient examined, no change in status, stable for surgery.  I have reviewed the patient's chart and labs.  Questions were answered to the patient's satisfaction.     Javier Docker  Patient has right foot drop. Has 3/5 inversion, no eversion or dorsiflexion. EHL 1/5. Anterior thigh pain. 1+ DP bilat. No DVT Left 4/5 EHL, TA, PF

## 2020-12-19 NOTE — Anesthesia Postprocedure Evaluation (Signed)
Anesthesia Post Note  Patient: [name]  Procedure(s) Performed: Microlumbar decompression Lumbar One-Two, Lumbar Two-Three/Lumbar Three-Four/Lumbar Four-Five     Patient location during evaluation: PACU Anesthesia Type: General Level of consciousness: awake and alert and oriented Pain management: pain level controlled Vital Signs Assessment: post-procedure vital signs reviewed and stable Respiratory status: spontaneous breathing, nonlabored ventilation and respiratory function stable Cardiovascular status: blood pressure returned to baseline Postop Assessment: no apparent nausea or vomiting Anesthetic complications: no   No notable events documented.  Last Vitals:  Vitals:   12/19/20 1300 12/19/20 1315  BP: 124/62 133/78  Pulse: 74 79  Resp: 15 13  Temp:    SpO2: 92% 95%    Last Pain:  Vitals:   12/19/20 1320  TempSrc:   PainSc: 6                  Kayla Bailey

## 2020-12-19 NOTE — Brief Op Note (Signed)
12/19/2020  11:53 AM  PATIENT:  Kayla Bailey  65 y.o. female  PRE-OPERATIVE DIAGNOSIS:  Spinal stenosis L1-L5  POST-OPERATIVE DIAGNOSIS:  Spinal stenosis Lumbar One-Lumbar Five  PROCEDURE:  Procedure(s): Microlumbar decompression Lumbar One-Two, Lumbar Two-Three/Lumbar Three-Four/Lumbar Four-Five (N/A)  SURGEON:  Surgeon(s) and Role:    Jene Every, MD - Primary  PHYSICIAN ASSISTANT:   ASSISTANTS: Bissell   ANESTHESIA:   general  EBL:  100 mL   BLOOD ADMINISTERED:none  DRAINS: (1) Hemovact drain(s) in the open with  Suction Open   LOCAL MEDICATIONS USED:  MARCAINE     SPECIMEN:  No Specimen  DISPOSITION OF SPECIMEN:  N/A  COUNTS:  YES  TOURNIQUET:  * No tourniquets in log *  DICTATION: .Other Dictation: Dictation Number [id number]  PLAN OF CARE: Admit for overnight observation  PATIENT DISPOSITION:  PACU - hemodynamically stable.   Delay start of Pharmacological VTE agent (>24hrs) due to surgical blood loss or risk of bleeding: yes

## 2020-12-19 NOTE — Discharge Instructions (Addendum)

## 2020-12-19 NOTE — Progress Notes (Signed)
Pharmacy Antibiotic Note  Kayla Bailey is a 65 y.o. female admitted on 12/19/2020 for microlumbar decompression  Pharmacy has been consulted for vancomycin dosing for surgical prophylaxis, drain in place.   Plan: Start vancomycin 1250mg  IV q12h nomogram dosing  Monitor renal function, cultures, and length of therapy  Height: 5\' 7"  (170.2 cm) Weight: 95.3 kg (210 lb) IBW/kg (Calculated) : 61.6  Temp (24hrs), Avg:97.7 F (36.5 C), Min:97.3 F (36.3 C), Max:97.8 F (36.6 C)  Recent Labs  Lab 12/16/20 1142  WBC 7.7  CREATININE 0.62    Estimated Creatinine Clearance: 84.2 mL/min (by C-G formula based on SCr of 0.62 mg/dL).    Allergies  Allergen Reactions   Codeine Itching and Nausea Only   Golimumab Swelling   Integrilin [Eptifibatide]     Unknown reaction   Keflex [Cephalexin]     Unknown reaction    Lisinopril     Unknown reaction   Penicillins     Unknown reaction    Solifenacin     Other reaction(s): Other (See Comments) Caused blurred vision and "crusty lips"    Antimicrobials this admission: Vancomycin 9/29 >>  Dose adjustments this admission: N/a  Microbiology results:    Thank you for allowing pharmacy to be a part of this patient's care.  12/18/20, PharmD, BCPS Clinical Pharmacist 12/19/2020 3:00 PM

## 2020-12-19 NOTE — Progress Notes (Signed)
Orthopedic Tech Progress Note Patient Details:  Kayla Bailey 02/16/1956 [id number] Ortho Devices Type of Ortho Device: Lumbar corsett Ortho Device/Splint Location: Back Ortho Device/Splint Interventions: Ordered      Genelle Bal Augustin Bun 12/19/2020, 4:32 PM

## 2020-12-19 NOTE — Transfer of Care (Signed)
Immediate Anesthesia Transfer of Care Note  Patient: Kayla Bailey  Procedure(s) Performed: Microlumbar decompression Lumbar One-Two, Lumbar Two-Three/Lumbar Three-Four/Lumbar Four-Five  Patient Location: PACU  Anesthesia Type:General  Level of Consciousness: drowsy, patient cooperative and responds to stimulation  Airway & Oxygen Therapy: Patient Spontanous Breathing and Patient connected to face mask oxygen  Post-op Assessment: Report given to RN and Post -op Vital signs reviewed and stable  Post vital signs: Reviewed and stable  Last Vitals:  Vitals Value Taken Time  BP 127/86 12/19/20 1158  Temp    Pulse 90 12/19/20 1201  Resp 18 12/19/20 1201  SpO2 96 % 12/19/20 1201  Vitals shown include unvalidated device data.  Last Pain:  Vitals:   12/19/20 0555  TempSrc: Oral  PainSc:       Patients Stated Pain Goal: 3 (12/19/20 0551)  Complications: No notable events documented.

## 2020-12-19 NOTE — Op Note (Signed)
Kayla Bailey, [facility] MEDICAL RECORD NO: [id number] ACCOUNT NO: [id number] DATE OF BIRTH: April 19, 1955 FACILITY: [facility] LOCATION: [facility] PHYSICIAN: Javier Docker, MD  Operative Report   DATE OF PROCEDURE: 12/19/2020  PREOPERATIVE DIAGNOSIS:  Spinal stenosis, L1-L2, L2-L3, L3-L4, L4-L5.  POSTOPERATIVE DIAGNOSIS:  Spinal stenosis, L1-L2, L2-L3, L3-L4, L4-L5.  PROCEDURE PERFORMED:  Microlumbar decompression, L1-L2, L2-L3, L3-L4, L4-L5 with foraminotomies of L5, L4, L3 bilaterally.  ANESTHESIA:  General.  ASSISTANT:  Andrez Grime, PA  HISTORY:  This 65 year old female with neurogenic claudication secondary to spinal stenosis.  She had a foot drop on the right with no dorsiflexion or eversion.  Had weakness on the left as well.  She is unable to ambulate.  Had anterior thigh pain.  She  had an MRI and CT myelogram that demonstrated a complete blocks at L1-L2 through L4-L5.  She had multilevel disk degeneration and mild scoliosis.  She had no instability.  She was indicated for microlumbar decompression from L1 through L5.  Risks and  benefits discussed including bleeding, infection, damage to neurovascular structures, no change in symptoms, worsening symptoms, DVT, PE, anesthetic complications, need for fusion in the future, etc.  DESCRIPTION OF PROCEDURE:  With the patient in supine position after induction of adequate general anesthesia, vancomycin and gentamicin she was placed prone on the Wilson frame.  All bony prominences were well padded.  Lumbar region was prepped and  draped in the usual sterile fashion.  She had a Foley to gravity.  Two 18-gauge spinal needle was utilized to localize the spinous process of L5 to the spinous process of S1.  Incision was made from above the spinous process of 1 just below the spinous  process of 5.  Subcutaneous tissues were dissected.  Electrocautery was utilized to achieve hemostasis.  We had infiltrated Marcaine with epinephrine and subcutaneous  tissue.  Dorsal lumbar fascia divided in line with skin incision.  Paraspinous muscles  elevated from the lamina of L1-L2, L2-L3, L3-L4 and L4-L5.  Bipolar electrocautery was utilized to achieve hemostasis.  We achieved strict hemostasis.  Confirmatory radiograph was obtained with a marker on the spinous process.  Initially, a 5 and of 2.   Next, we placed two self-retaining McCulloch retractors.  We then used a Leksell rongeur to remove the partial spinous process of 5, full spinous process of 4, 3, 2 and partially of 1.  There were a very small interlaminar spaces at each level.  I then  used a high-speed diamond bur to decorticate and thin the lamina of 5, 4, 3 and 2.  Next, I then used a straight curette to detach ligamentum flavum from the cephalad edge of 5.  I then used a 2 mm Kerrison to then begin hemilaminotomies of the caudad  edge of 4.  First, centrally and then laterally.  Removing ligamentum flavum from the interspace with a Neuro patty was placed beneath the ligamentum flavum.  There was severe stenosis bilaterally noted.  I used a Facilities manager and a 2 mm Kerrison  for that purpose.  I also performed a hemilaminotomy of the cephalad edge of 5 and performed foraminotomies of L5 bilaterally.  Following the decompression at 4-5, I covered it with a neuro patty and then continued the cephalad.  I then performed a  hemilaminotomies of the caudad edge of 3 and detaching the ligamentum flavum.  There was severe stenosis noted.  I detached ligamentum flavum from the cephalad edge of 4.  Removing ligamentum flavum from the interspace with  a Facilities manager.  I then  protected the thecal sac with a neuro patty and a Woodson and remove the lamina from the neural arch of 4, decompressing the lateral recesses to the medial border of the pedicle bilaterally with a 2 mm Kerrison and a Facilities manager.  There was severe  stenosis noted on the right.  I continued cephalad removing all ligamentum  flavum from 3-4 and bilaterally, decompressing the lateral recesses.  I then continued cephalad and removed in a similar fashion, the neural arch of 3.  This was after performing  hemilaminotomies of the caudad edge of 2, detached ligamentum flavum from the cephalad edge of 3 with a micro curette.  I removed the neural arch of 3.  There was severe stenosis noted centrally and bilaterally at 3-4.  After removing the neural arch of  3 I removed all ligamentum flavum from the 2-3 space and decompressed the lateral recesses to the medial border of the pedicle bilaterally.  At 5 and 4, we performed foraminotomies as well.  The severely stenotic on both sides.  Continued cephalad I then  removed the lamina of 2 as I continued cephalad again detached ligamentum flavum from the caudad edge of 2.  We used a straight curette to remove the final portion of the arch of 2.  Did not retract the thecal sac up at this level.  I continued and  performed hemilaminotomies of the caudad edge of 3 and removed ligamentum flavum from the interspace at 1-2.  It was hypertrophic.  An extensive ligamentum flavum was noted here.  A combination of that and the neural arch was consistent with that seen on  the myelogram.  After removing this, there was good restoration of the thecal sac and a Woodson retract probe passed freely out the foramens of L1 cephalad above the pedicle of 1 and out the foramen of 2, 3, 4, and 5 bilaterally.  Throughout this case,  bipolar cautery was utilized to achieve hemostasis.  We obtained intraoperative radiographs prior to proceeding with the final removal of the lamina of 2.  Intermittently throughout the case, we released traction upon the paraspinous musculature and  irrigated the wound.  Bone wax was placed on the cancellous surfaces.  We continued on both sides of the decompression using a straight curette to smooth the edges of the decompression.  Again, we checked the levels at 4-5, 3-4, 2-3, and  1-2 and no  residual pathology minimal to decompression.  At 1-2, there was a small protrusion 1-2 to the right.  Due to the configuration of the patient's scoliosis at that level we were not able to gain access to the disk.  I felt though that the posterior  decompression should suffice in terms of the stenosis at that level.  Again, bone wax was placed on all cancellous surfaces were copiously irrigated with saline.  We used thrombin-soaked Gelfoam in the wound and then removed it.  We performed a Valsalva  at 40, no evidence of CSF leakage or active bleeding.  Good restoration of the thecal sac, good decompression.  Down the levels at 2-3 and 3-4 were the more stenotic.  I then removed the collar retractors, irrigated the paraspinous musculature achieved  strict hemostasis.  Placed Hemovac brought out through a lateral stab wound in the skin.  Dorsal lumbar fascia reapproximated with #1 Vicryl in interrupted figure-of-eight suture, subcutaneous with 2-0 and skin with staples.  Wound was dressed sterilely.   She was then  placed supine in hospital bed, extubated without difficulty and transported to the recovery room in satisfactory condition.  The patient tolerated the procedure well.  No complications.  Assistant, Andrez Grime was used throughout the case for intermittent suction, closure, patient positioning and retraction.  100 mL of blood loss.   PUS D: 12/19/2020 12:06:17 pm T: 12/19/2020 1:45:00 pm  JOB: [id number]/ [id number]

## 2020-12-19 NOTE — Anesthesia Procedure Notes (Signed)
Procedure Name: Intubation Date/Time: 12/19/2020 7:43 AM Performed by: Caren Macadam, CRNA Pre-anesthesia Checklist: Patient identified, Emergency Drugs available, Suction available and Patient being monitored Patient Re-evaluated:Patient Re-evaluated prior to induction Oxygen Delivery Method: Circle system utilized Preoxygenation: Pre-oxygenation with 100% oxygen Induction Type: IV induction Ventilation: Mask ventilation without difficulty Laryngoscope Size: Miller and 2 Tube type: Oral Tube size: 7.0 mm Number of attempts: 1 Airway Equipment and Method: Stylet Placement Confirmation: ETT inserted through vocal cords under direct vision, positive ETCO2 and breath sounds checked- equal and bilateral Secured at: 23 cm Tube secured with: Tape Dental Injury: Teeth and Oropharynx as per pre-operative assessment

## 2020-12-20 ENCOUNTER — Encounter (HOSPITAL_COMMUNITY): Payer: Self-pay | Admitting: Specialist

## 2020-12-20 DIAGNOSIS — M5136 Other intervertebral disc degeneration, lumbar region: Secondary | ICD-10-CM | POA: Diagnosis not present

## 2020-12-20 LAB — CBC
HCT: 41.4 % (ref 36.0–46.0)
Hemoglobin: 13.8 g/dL (ref 12.0–15.0)
MCH: 31.2 pg (ref 26.0–34.0)
MCHC: 33.3 g/dL (ref 30.0–36.0)
MCV: 93.5 fL (ref 80.0–100.0)
Platelets: 267 10*3/uL (ref 150–400)
RBC: 4.43 MIL/uL (ref 3.87–5.11)
RDW: 14.6 % (ref 11.5–15.5)
WBC: 12.1 10*3/uL — ABNORMAL HIGH (ref 4.0–10.5)
nRBC: 0 % (ref 0.0–0.2)

## 2020-12-20 LAB — BASIC METABOLIC PANEL
Anion gap: 9 (ref 5–15)
BUN: 13 mg/dL (ref 8–23)
CO2: 26 mmol/L (ref 22–32)
Calcium: 9 mg/dL (ref 8.9–10.3)
Chloride: 105 mmol/L (ref 98–111)
Creatinine, Ser: 0.65 mg/dL (ref 0.44–1.00)
GFR, Estimated: >60 mL/min (ref >60)
Glucose, Bld: 113 mg/dL — ABNORMAL HIGH (ref 70–99)
Potassium: 3.9 mmol/L (ref 3.5–5.1)
Sodium: 140 mmol/L (ref 135–145)

## 2020-12-20 NOTE — Evaluation (Signed)
Physical Therapy Evaluation Patient Details Name: Kayla Bailey MRN: [id number] DOB: [birth year] Today's Date: 12/20/2020  History of Present Illness  65 y.o. female presents to [facility] hospital on 12/19/2020 with R foot drop. CT myelogram of spine demonstrates advanced multilevel disc degeneration. Pt underwent microlumbar decompression at L1-L5 with foraminotomies at L3-5 bilaterally on 12/19/2020. PMH includes anemia, OA, CMV, COPD, depression, GERD, HTN, MI.  Clinical Impression  Pt presents to PT with deficits in balance, power, gait, endurance, and knowledge of back precautions. Pt benefits from reinforcement of back precautions for bed mobility and with ADLs. Pt is able to mobilize at a supervision level and does not demonstrate significant gait deviations with support of an assistive device. Pt will benefit from continued aggressive mobilization to reduce pain and aide in a return to independence. PT recommends discharge home when medically ready.       Recommendations for follow up therapy are one component of a multi-disciplinary discharge planning process, led by the attending physician.  Recommendations may be updated based on patient status, additional functional criteria and insurance authorization.  Follow Up Recommendations Outpatient PT (outpatient PT for balance once cleared by surgeon)    Equipment Recommendations  None recommended by PT (pt able to utilize cane vs rollator upon return home)    Recommendations for Other Services       Precautions / Restrictions Precautions Precautions: Fall;Back Precaution Booklet Issued: Yes (comment) Precaution Comments: chronic R foot drop Required Braces or Orthoses: Spinal Brace Spinal Brace: Lumbar corset;Applied in sitting position Restrictions Weight Bearing Restrictions: No      Mobility  Bed Mobility Overal bed mobility: Needs Assistance Bed Mobility: Rolling;Sidelying to Sit;Sit to Sidelying Rolling: Supervision Sidelying  to sit: Supervision     Sit to sidelying: Supervision General bed mobility comments: cues for technique    Transfers Overall transfer level: Needs assistance Equipment used: Rolling walker (2 wheeled) Transfers: Sit to/from Stand Sit to Stand: Supervision         General transfer comment: cues for hand placement  Ambulation/Gait Ambulation/Gait assistance: Supervision Gait Distance (Feet): 200 Feet Assistive device: Rolling walker (2 wheeled) Gait Pattern/deviations: Step-through pattern Gait velocity: functional Gait velocity interpretation: 1.31 - 2.62 ft/sec, indicative of limited community ambulator General Gait Details: pt with slowed step-through gait, pt with good R foot clearance and no foot drag noted  Stairs            Wheelchair Mobility    Modified Rankin (Stroke Patients Only)       Balance Overall balance assessment: Needs assistance Sitting-balance support: No upper extremity supported;Feet supported Sitting balance-Leahy Scale: Good     Standing balance support: No upper extremity supported Standing balance-Leahy Scale: Fair                               Pertinent Vitals/Pain Pain Assessment: 0-10 Pain Score: 8  Pain Location: low back Pain Descriptors / Indicators: Grimacing Pain Intervention(s): Monitored during session    Home Living Family/patient expects to be discharged to:: Private residence Living Arrangements: Spouse/significant other Available Help at Discharge: Family;Available 24 hours/day Type of Home: House Home Access: Ramped entrance     Home Layout: One level Home Equipment: Walker - 4 wheels;Cane - single point;Shower seat;Bedside commode;Adaptive equipment      Prior Function Level of Independence: Independent with assistive device(s)         Comments: pt utilizing cane due to foot drop  Hand Dominance        Extremity/Trunk Assessment   Upper Extremity Assessment Upper Extremity  Assessment: Overall WFL for tasks assessed    Lower Extremity Assessment Lower Extremity Assessment: RLE deficits/detail RLE Deficits / Details: chronic R foot drop    Cervical / Trunk Assessment Cervical / Trunk Assessment: Other exceptions Cervical / Trunk Exceptions: s/p lumbar surgery  Communication   Communication: No difficulties  Cognition Arousal/Alertness: Awake/alert Behavior During Therapy: WFL for tasks assessed/performed Overall Cognitive Status: Within Functional Limits for tasks assessed                                 General Comments:  (requries cues for implementation of back precautions)      General Comments General comments (skin integrity, edema, etc.): VSS on RA    Exercises     Assessment/Plan    PT Assessment Patient needs continued PT services  PT Problem List Decreased strength;Decreased activity tolerance;Decreased balance;Decreased mobility;Decreased knowledge of precautions;Pain       PT Treatment Interventions DME instruction;Gait training;Functional mobility training;Therapeutic activities;Therapeutic exercise;Balance training;Neuromuscular re-education;Patient/family education    PT Goals (Current goals can be found in the Care Plan section)  Acute Rehab PT Goals Patient Stated Goal: to go home PT Goal Formulation: With patient Time For Goal Achievement: 12/25/20 Potential to Achieve Goals: Good    Frequency Min 5X/week   Barriers to discharge        Co-evaluation               AM-PAC PT "6 Clicks" Mobility  Outcome Measure Help needed turning from your back to your side while in a flat bed without using bedrails?: A Little Help needed moving from lying on your back to sitting on the side of a flat bed without using bedrails?: A Little Help needed moving to and from a bed to a chair (including a wheelchair)?: A Little Help needed standing up from a chair using your arms (e.g., wheelchair or bedside chair)?: A  Little Help needed to walk in hospital room?: A Little Help needed climbing 3-5 steps with a railing? : A Little 6 Click Score: 18    End of Session Equipment Utilized During Treatment: Back brace Activity Tolerance: Patient tolerated treatment well Patient left: in bed;with call bell/phone within reach Nurse Communication: Mobility status PT Visit Diagnosis: Other abnormalities of gait and mobility (R26.89);Muscle weakness (generalized) (M62.81);Other symptoms and signs involving the nervous system (R29.898)    Time: 1610-9604 PT Time Calculation (min) (ACUTE ONLY): 20 min   Charges:   PT Evaluation $PT Eval Low Complexity: 1 Low          Arlyss Gandy, PT, DPT Acute Rehabilitation Pager: [phone]   Arlyss Gandy 12/20/2020, 9:23 AM

## 2020-12-20 NOTE — Progress Notes (Signed)
Subjective: 1 Day Post-Op Procedure(s) (LRB): Microlumbar decompression Lumbar One-Two, Lumbar Two-Three/Lumbar Three-Four/Lumbar Four-Five (N/A) Patient reports pain as moderate.    Objective: Vital signs in last 24 hours: Temp:  [97.3 F (36.3 C)-98.3 F (36.8 C)] 98 F (36.7 C) (09/30 0744) Pulse Rate:  [73-92] 85 (09/30 0744) Resp:  [13-20] 18 (09/30 0744) BP: (108-157)/(55-95) 157/95 (09/30 0744) SpO2:  [91 %-96 %] 93 % (09/30 0744)  Intake/Output from previous day: 09/29 0701 - 09/30 0700 In: 1860 [P.O.:360; I.V.:1500] Out: 735 [Urine:425; Drains:210; Blood:100] Intake/Output this shift: No intake/output data recorded.  Recent Labs    12/20/20 0423  HGB 13.8   Recent Labs    12/20/20 0423  WBC 12.1*  RBC 4.43  HCT 41.4  PLT 267   Recent Labs    12/20/20 0423  NA 140  K 3.9  CL 105  CO2 26  BUN 13  CREATININE 0.65  GLUCOSE 113*  CALCIUM 9.0   No results for input(s): LABPT, INR in the last 72 hours.  Neurologically intact ABD soft Neurovascular intact Sensation intact distally Intact pulses distally Dorsiflexion/Plantar flexion intact Incision: dressing C/D/I and no drainage No cellulitis present Compartment soft No sign of DVT Aquacel removed, honeycomb placed, clean and dry  Assessment/Plan: 1 Day Post-Op Procedure(s) (LRB): Microlumbar decompression Lumbar One-Two, Lumbar Two-Three/Lumbar Three-Four/Lumbar Four-Five (N/A) Advance diet Up with therapy D/C IV fluids D/C today Discussed D/C instructions, dressing instructions, Lspine precautions   Dorothy Spark 12/20/2020, 8:25 AM

## 2020-12-20 NOTE — Plan of Care (Signed)
Patient alert and oriented, voiding adequately, skin clean, dry and intact without evidence of skin break down, or symptoms of complications - no redness or edema noted, only slight tenderness at site.  Patient states pain is manageable at time of discharge. Patient has an appointment with MD in 2 weeks 

## 2020-12-20 NOTE — Discharge Summary (Signed)
Physician Discharge Summary   Patient ID: ICA DAYE MRN: [id number] DOB/AGE: November 11, 1955 65 y.o.  Admit date: 12/19/2020 Discharge date: 12/20/2020  Primary Diagnosis:   Spinal stenosis L1-L5  Admission Diagnoses:  Past Medical History:  Diagnosis Date   Anemia    hx of years ago    Arthritis    osteoarthritis, psoriatic arthritis    Barrett's esophagus    CMV (cytomegalovirus infection) (HCC) 2001   COPD (chronic obstructive pulmonary disease) (HCC)    Coronary artery disease    Deaf, right 1992   Depression    Diverticulitis    Foot drop, right foot 2022   Gastroparesis    GERD (gastroesophageal reflux disease)    Hypercholesteremia    Hypertension    Kidney infection    Myocardial infarction (HCC) 09/2008   Peripheral neuropathy    Psoriasis 1983   Seasonal allergies    Sleep apnea    cpap-    Urgency of urination    Discharge Diagnoses:   Active Problems:   Spinal stenosis at L4-L5 level  Procedure:  Procedure(s) (LRB): Microlumbar decompression Lumbar One-Two, Lumbar Two-Three/Lumbar Three-Four/Lumbar Four-Five (N/A)   Consults: None  HPI:  See H&P    Laboratory Data: Hospital Outpatient Visit on 12/16/2020  Component Date Value Ref Range Status   WBC 12/16/2020 7.7  4.0 - 10.5 K/uL Final   RBC 12/16/2020 4.80  3.87 - 5.11 MIL/uL Final   Hemoglobin 12/16/2020 15.1 (A) 12.0 - 15.0 g/dL Final   HCT 31/51/7616 45.3  36.0 - 46.0 % Final   MCV 12/16/2020 94.4  80.0 - 100.0 fL Final   MCH 12/16/2020 31.5  26.0 - 34.0 pg Final   MCHC 12/16/2020 33.3  30.0 - 36.0 g/dL Final   RDW 07/37/1062 14.9  11.5 - 15.5 % Final   Platelets 12/16/2020 295  150 - 400 K/uL Final   nRBC 12/16/2020 0.0  0.0 - 0.2 % Final   Performed at [facility], [address], [place], [place] [address]   Sodium 12/16/2020 139  135 - 145 mmol/L Final   Potassium 12/16/2020 3.8  3.5 - 5.1 mmol/L Final   Chloride 12/16/2020 106  98 - 111 mmol/L Final   CO2 12/16/2020  24  22 - 32 mmol/L Final   Glucose, Bld 12/16/2020 127 (A) 70 - 99 mg/dL Final   Glucose reference range applies only to samples taken after fasting for at least 8 hours.   BUN 12/16/2020 14  8 - 23 mg/dL Final   Creatinine, Ser 12/16/2020 0.62  0.44 - 1.00 mg/dL Final   Calcium 46/27/0350 9.1  8.9 - 10.3 mg/dL Final   GFR, Estimated 12/16/2020 >60  >60 mL/min Final   Comment: (NOTE) Calculated using the CKD-EPI Creatinine Equation (2021)    Anion gap 12/16/2020 9  5 - 15 Final   Performed at [facility], [address] [place], [place] [address]   SARS Coronavirus 2 12/16/2020 NEGATIVE  NEGATIVE Final   Comment: (NOTE) SARS-CoV-2 target nucleic acids are NOT DETECTED.  The SARS-CoV-2 RNA is generally detectable in upper and lower respiratory specimens during the acute phase of infection. Negative results do not preclude SARS-CoV-2 infection, do not rule out co-infections with other pathogens, and should not be used as the sole basis for treatment or other patient management decisions. Negative results must be combined with clinical observations, patient history, and epidemiological information. The expected result is Negative.  Fact Sheet for Patients: HairSlick.no  Fact Sheet for Healthcare Providers: quierodirigir.com  This test is not yet approved or cleared by the [place] FDA and  has been authorized for detection and/or diagnosis of SARS-CoV-2 by FDA under an Emergency Use Authorization (EUA). This EUA will remain  in effect (meaning this test can be used) for the duration of the COVID-19 declaration under Se                          ction 564(b)(1) of the Act, 21 U.S.C. section 360bbb-3(b)(1), unless the authorization is terminated or revoked sooner.  Performed at [facility], [address] [place], [place] [address]    MRSA, PCR 12/16/2020 NEGATIVE  NEGATIVE Final   Staphylococcus aureus  12/16/2020 NEGATIVE  NEGATIVE Final   Comment: (NOTE) The Xpert SA Assay (FDA approved for NASAL specimens in patients 99 years of age and older), is one component of a comprehensive surveillance program. It is not intended to diagnose infection nor to guide or monitor treatment. Performed at [facility], [address], [place], [place] [address]    Recent Labs    12/20/20 0423  HGB 13.8   Recent Labs    12/20/20 0423  WBC 12.1*  RBC 4.43  HCT 41.4  PLT 267   Recent Labs    12/20/20 0423  NA 140  K 3.9  CL 105  CO2 26  BUN 13  CREATININE 0.65  GLUCOSE 113*  CALCIUM 9.0   No results for input(s): LABPT, INR in the last 72 hours.  X-Rays:DG Lumbar Spine 2-3 Views  Result Date: 12/17/2020 CLINICAL DATA:  Preoperative evaluation for micro lumbar decompression L1-L5 EXAM: LUMBAR SPINE - 2-3 VIEW COMPARISON:  None FINDINGS: Five non-rib-bearing lumbar vertebra. Osseous demineralization. Multilevel disc space narrowing and endplate spur formation. Vacuum phenomenon at L3-L4 and L5-S1. Mild facet degenerative changes lower lumbar spine. Vertebral body heights maintained without fracture or bone destruction. Minimal retrolisthesis L5-S1. SI joints preserved. Atherosclerotic calcifications aorta. IMPRESSION: Multilevel degenerative disc and facet disease changes lumbar spine. Aortic Atherosclerosis (ICD10-I70.0). Electronically Signed   By: Ulyses Southward M.D.   On: 12/17/2020 09:38   DG Lumbar Spine Complete  Result Date: 12/19/2020 CLINICAL DATA:  Intraop imaging for lumbar spine level localization. EXAM: LUMBAR SPINE - COMPLETE 4+ VIEW COMPARISON:  12/16/2020 FINDINGS: Four lateral projection images of the lumbar spine were obtained in the operating room. On the first image there are surgical probes posterior to the L3 spinous process and L5 spinous process. On the second image tissue spreaders and surgical clamps posterior to the L2 through L5 vertebra. On the third image  there is a surgical probe with tip posterior to the superior endplate of the L2 vertebral body. A second probe is posterior to the L5 vertebral body. Tissue spreaders are posterior to L2-3 and L4-5. On the fourth image there is a surgical probe posterior to the inferior endplate of the L1 vertebral body and a second probe is posterior to the L5 vertebral body. Tissue spreaders are seen posterior to L2-3 and L4-5. IMPRESSION: Intraoperative radiographs of the lumbar spine obtained for localization. Electronically Signed   By: Signa Kell M.D.   On: 12/19/2020 14:21   CT LUMBAR SPINE W CONTRAST  Result Date: 11/22/2020 CLINICAL DATA:  Chronic low back pain.  Spinal stenosis. EXAM: CT MYELOGRAPHY LUMBAR SPINE TECHNIQUE: CT imaging of the lumbar spine was performed after Isovue 200M contrast administration. Multiplanar CT image reconstructions  were also generated. COMPARISON:  No prior cross-sectional imaging. Lumbar myelogram images 11/21/2020 FINDINGS: Lumbar myelogram reported separately Segmentation:  Normal Alignment: Normal sagittal alignment. Minimal dextroscoliosis at L3-4. Vertebrae: Negative for fracture or mass lesion. Conus medullaris: Extends to the L2-3 level. The conus medullaris is displaced posteriorly and flattened due to a large disc protrusion at L1-2. Paraspinal and other soft tissues: Atherosclerotic calcification aorta and iliac arteries without aneurysm. Negative for paraspinous mass or adenopathy. Disc levels: T12-L1: Small central disc protrusion.  Negative for stenosis L1-2: Disc space narrowing and disc bulging. Moderate to large central disc protrusion. Moderate facet hypertrophy. Conus medullaris is flattened and displaced posteriorly due to moderate spinal stenosis. Moderate subarticular stenosis bilaterally. L2-3: Disc degeneration with diffuse disc bulging. Broad-based disc protrusion with associated endplate spurring. Bilateral facet hypertrophy. Moderate to severe spinal  stenosis. Moderate subarticular stenosis bilaterally L3-4: Disc degeneration with disc bulging and diffuse endplate spurring. Bilateral facet hypertrophy. Effacement of the contrast in the thecal sac with severe spinal stenosis. Severe subarticular stenosis bilaterally. L4-5: Disc degeneration with diffuse disc bulging and diffuse endplate spurring. Broad-based central disc protrusion. Facet and ligamentum flavum hypertrophy. Moderate spinal stenosis. Moderate to severe subarticular stenosis. Mild foraminal narrowing bilaterally L5-S1: Disc degeneration with diffuse endplate spurring and bilateral facet hypertrophy. Moderate to severe subarticular foraminal stenosis on the right. Moderate left subarticular stenosis. IMPRESSION: 1. Advanced multilevel spondylosis throughout the lumbar spine. Advanced spinal and subarticular stenosis at multiple levels as above. 2. Conus medullaris is flattened and displaced posteriorly due to a disc protrusion at L1-2 Electronically Signed   By: Marlan Palau M.D.   On: 11/22/2020 09:33   DG MYELOGRAPHY LUMBAR INJ LUMBOSACRAL  Result Date: 11/21/2020 CLINICAL DATA:  65 year old female with chronic low back pain. Outside institution MRI describes severe narrowing worst at L4-L5 but also advanced at L2-L3 and L3-L4. FLUOROSCOPY TIME:  0 minutes 39 seconds 14.4 mGy PROCEDURE: LUMBAR MYELOGRAM After thorough discussion of risks and benefits of the procedure including bleeding, infection, injury to nerves, blood vessels, adjacent structures as well as headache and CSF leak, written and oral informed consent was obtained. Consent was obtained by Dr. Malachy Moan. Time out form was completed. Patient was positioned prone on the fluoroscopy table. Local anesthesia was provided with 1% lidocaine without epinephrine after prepped and draped in the usual sterile fashion. Puncture was performed at L5-S1 using a 3 1/2 inch 22-gauge spinal needle via paramedian approach. Using a single  pass through the dura, the needle was placed within the thecal sac, with return of clear CSF. 15 mL of Isovue M-200 was injected into the thecal sac, with normal opacification of the nerve roots and cauda equina consistent with free flow within the subarachnoid space. I personally performed the lumbar puncture and administered the intrathecal contrast. I also personally supervised acquisition of the myelogram images. COMPARISON:  None FINDINGS: Alignment: Normal.  No spondylo listhesis. Vertebra: No acute fracture or malalignment. No lytic or blastic osseous lesion. Mobility: No evidence of pathologic motion on flexion, extension or lateral bending. T12-L1: No significant epidural effacement. L1-L2: Moderate ventral epidural defect and posterior epidural defect with significant effacement of the CSF space. L2-L3: Large ventral epidural defect and significant posterior epidural defect with severe effacement of the CSF. L3-L4: Large ventral epidural defect with calcified posterior disc osteophyte complex. Severe effacement of the CSF space. Degenerative disc disease present with narrowing of the disc space and endplate sclerosis. L4-L5: Complete effacement of the CSF space. L5-S1: Small ventral epidural  defect. Focal L5-S1 degenerative disc disease with disc space narrowing and endplate sclerosis. IMPRESSION: 1. Successful L5-S1 lumbar puncture for lumbar myelogram. 2. Normal alignment without evidence of spondylolisthesis or pathologic motion on flexion, extension or lateral bending. 3. Advanced multilevel degenerative disc disease with significant effacement of the CSF space at L1-L2, L2-L3, L3-L4 and L4-L5. 4. Atherosclerotic calcifications visualized in the abdominal aorta. No evidence of aneurysm. 5. Please see separately dictated dedicated CT myelogram for further detail. Electronically Signed   By: Malachy Moan M.D.   On: 11/21/2020 13:44    EKG: Orders placed or performed during the hospital encounter  of 12/16/20   EKG 12 lead per protocol   EKG 12 lead per protocol     Hospital Course: Patient was admitted to [facility] and taken to the OR and underwent the above state procedure without complications.  Patient tolerated the procedure well and was later transferred to the recovery room and then to the orthopaedic floor for postoperative care.  They were given PO and IV analgesics for pain control following their surgery.  They were given 24 hours of postoperative antibiotics.   PT was consulted postop to assist with mobility and transfers.  The patient was allowed to be WBAT with therapy and was taught back precautions. Discharge planning was consulted to help with postop disposition and equipment needs.  Patient had a good night on the evening of surgery and started to get up OOB with therapy on day one. Patient was seen in rounds and was ready to go home on day one.  They were given discharge instructions and dressing directions.  They were instructed on when to follow up in the office with Dr. Shelle Iron.   Diet: Regular diet Activity:WBAT, Lspine precautions Follow-up:in 14 days Disposition - Home Discharged Condition: good   Discharge Instructions     Call MD / Call 911   Complete by: As directed    If you experience chest pain or shortness of breath, CALL 911 and be transported to the hospital emergency room.  If you develope a fever above 101 F, pus (white drainage) or increased drainage or redness at the wound, or calf pain, call your surgeon's office.   Constipation Prevention   Complete by: As directed    Drink plenty of fluids.  Prune juice may be helpful.  You may use a stool softener, such as Colace (over the counter) 100 mg twice a day.  Use MiraLax (over the counter) for constipation as needed.   Diet - low sodium heart healthy   Complete by: As directed    Increase activity slowly as tolerated   Complete by: As directed    Post-operative opioid taper instructions:    Complete by: As directed    POST-OPERATIVE OPIOID TAPER INSTRUCTIONS: It is important to wean off of your opioid medication as soon as possible. If you do not need pain medication after your surgery it is ok to stop day one. Opioids include: Codeine, Hydrocodone(Norco, Vicodin), Oxycodone(Percocet, oxycontin) and hydromorphone amongst others.  Long term and even short term use of opiods can cause: Increased pain response Dependence Constipation Depression Respiratory depression And more.  Withdrawal symptoms can include Flu like symptoms Nausea, vomiting And more Techniques to manage these symptoms Hydrate well Eat regular healthy meals Stay active Use relaxation techniques(deep breathing, meditating, yoga) Do Not substitute Alcohol to help with tapering If you have been on opioids for less than two weeks and do not have pain than it  is ok to stop all together.  Plan to wean off of opioids This plan should start within one week post op of your joint replacement. Maintain the same interval or time between taking each dose and first decrease the dose.  Cut the total daily intake of opioids by one tablet each day Next start to increase the time between doses. The last dose that should be eliminated is the evening dose.         Allergies as of 12/20/2020       Reactions   Codeine Itching, Nausea Only   Golimumab Swelling   Integrilin [eptifibatide]    Unknown reaction   Keflex [cephalexin]    Unknown reaction    Lisinopril    Unknown reaction   Penicillins    Unknown reaction   Solifenacin    Other reaction(s): Other (See Comments) Caused blurred vision and "crusty lips"        Medication List     STOP taking these medications    aspirin EC 81 MG tablet   atorvastatin 80 MG tablet Commonly known as: LIPITOR   celecoxib 200 MG capsule Commonly known as: CELEBREX       TAKE these medications    acetaminophen 500 MG tablet Commonly known as:  TYLENOL Take 1,000 mg by mouth every 6 (six) hours as needed for moderate pain.   docusate sodium 100 MG capsule Commonly known as: Colace Take 1 capsule (100 mg total) by mouth 2 (two) times daily as needed for mild constipation. What changed:  when to take this reasons to take this   DULoxetine 60 MG capsule Commonly known as: CYMBALTA Take 60 mg by mouth 2 (two) times daily.   ferrous sulfate 325 (65 FE) MG tablet Take 325 mg by mouth daily.   folic acid 1 MG tablet Commonly known as: FOLVITE Take 1 mg by mouth at bedtime.   gabapentin 600 MG tablet Commonly known as: NEURONTIN Take 1,200 mg by mouth 2 (two) times daily.   loratadine 10 MG tablet Commonly known as: CLARITIN Take 10 mg by mouth daily.   methocarbamol 500 MG tablet Commonly known as: Robaxin Take 1 tablet (500 mg total) by mouth every 8 (eight) hours as needed for muscle spasms.   methotrexate 50 MG/2ML injection Inject 10 mg into the muscle every Monday.   oxyCODONE 5 MG immediate release tablet Commonly known as: Oxy IR/ROXICODONE Take 1 tablet (5 mg total) by mouth every 4 (four) hours as needed for severe pain.   pantoprazole 40 MG tablet Commonly known as: PROTONIX Take 40 mg by mouth every morning.   polyethylene glycol 17 g packet Commonly known as: MIRALAX / GLYCOLAX Take 17 g by mouth daily.   propranolol 40 MG tablet Commonly known as: INDERAL Take 40 mg by mouth 2 (two) times daily.   ramipril 10 MG capsule Commonly known as: ALTACE Take 10 mg by mouth daily.   REMICADE IV Inject 1 each into the vein every 6 (six) weeks.   triamcinolone cream 0.1 % Commonly known as: KENALOG Apply 1 application topically 2 (two) times daily as needed (psoriasis).   vitamin B-12 1000 MCG tablet Commonly known as: CYANOCOBALAMIN Take 1,000 mcg by mouth daily.   VITAMIN C PO Take 2,000 mg by mouth daily.   Vitamin D 50 MCG (2000 UT) tablet Take 2,000 Units by mouth daily.         Follow-up Information     Jene Every, MD Follow up in  2 week(s).   Specialty: Orthopedic Surgery Contact information: [address] [place] [address] [phone]                 Signed: Andrez Grime, PA-C Orthopaedic Surgery 12/20/2020, 8:26 AM

## 2020-12-23 NOTE — Progress Notes (Signed)
Occupational Therapy Evaluation - late entry    12/20/20 1000  OT Visit Information  Last OT Received On 12/20/20  Assistance Needed +1  History of Present Illness 65 y.o. female presents to [facility] hospital on 12/19/2020 with R foot drop. CT myelogram of spine demonstrates advanced multilevel disc degeneration. Pt underwent microlumbar decompression at L1-L5 with foraminotomies at L3-5 bilaterally on 12/19/2020. PMH includes anemia, OA, CMV, COPD, depression, GERD, HTN, MI.  Precautions  Precautions Fall;Back  Precaution Booklet Issued Yes (comment)  Precaution Comments chronic R foot drop  Required Braces or Orthoses Spinal Brace  Spinal Brace Lumbar corset;Applied in sitting position  Restrictions  Weight Bearing Restrictions No  Home Living  Family/patient expects to be discharged to: Private residence  Living Arrangements Spouse/significant other  Available Help at Discharge Family;Available 24 hours/day  Type of Home House  Home Access Ramped entrance  Home Layout One level  Bathroom Shower/Tub Walk-in IT trainer - 4 wheels;Cane - single point;Shower Glass blower/designer  Prior Function  Level of Independence Independent with assistive device(s)  Comments pt utilizing cane due to foot drop  Communication  Communication No difficulties  Pain Assessment  Pain Assessment Faces  Faces Pain Scale 4  Pain Location low back  Pain Descriptors / Indicators Grimacing  Pain Intervention(s) Monitored during session  Cognition  Arousal/Alertness Awake/alert  Behavior During Therapy WFL for tasks assessed/performed  Overall Cognitive Status Within Functional Limits for tasks assessed  Upper Extremity Assessment  Upper Extremity Assessment Overall WFL for tasks assessed  Lower Extremity Assessment  Lower Extremity Assessment Defer to PT evaluation  Cervical / Trunk Assessment  Cervical / Trunk Assessment  Other exceptions  Cervical / Trunk Exceptions s/p lumbar surgery  ADL  Overall ADL's  Needs assistance/impaired  Eating/Feeding Independent  Grooming Independent  Upper Body Bathing Modified independent  Lower Body Bathing Modified independent  Upper Body Dressing  Independent  Lower Body Dressing Independent  Toilet Transfer Independent  Toileting- Clothing Manipulation and Hygiene Independent  Tub/ Shower Transfer Supervision/safety  Functional mobility during ADLs Supervision/safety  Vision- Assessment  Vision Assessment? No apparent visual deficits  Balance  Overall balance assessment Needs assistance  Sitting-balance support No upper extremity supported;Feet supported  Sitting balance-Leahy Scale Good  Standing balance support No upper extremity supported  Standing balance-Leahy Scale Fair  OT - End of Session  Equipment Utilized During Treatment Rolling walker;Back brace  Activity Tolerance Patient tolerated treatment well  Patient left in bed;with call bell/phone within reach  Nurse Communication Mobility status  OT Assessment  OT Recommendation/Assessment Patient does not need any further OT services  OT Visit Diagnosis Unsteadiness on feet (R26.81)  OT Problem List Impaired balance (sitting and/or standing)  AM-PAC OT "6 Clicks" Daily Activity Outcome Measure (Version 2)  Help from another person eating meals? 4  Help from another person taking care of personal grooming? 4  Help from another person toileting, which includes using toliet, bedpan, or urinal? 4  Help from another person bathing (including washing, rinsing, drying)? 4  Help from another person to put on and taking off regular upper body clothing? 4  Help from another person to put on and taking off regular lower body clothing? 4  6 Click Score 24  Progressive Mobility  What is the highest level of mobility based on the progressive mobility assessment? Level 5 (Walks with assist in room/hall) - Balance  while stepping forward/back and can walk in room  with assist - Complete  Mobility Out of bed for toileting;Out of bed to chair with meals;Ambulated independently in room  OT Recommendation  Follow Up Recommendations No OT follow up  OT Equipment None recommended by OT  Acute Rehab OT Goals  Patient Stated Goal to go home  OT Goal Formulation With patient  OT Time Calculation  OT Start Time (ACUTE ONLY) 1002  OT Stop Time (ACUTE ONLY) 1025  OT Time Calculation (min) 23 min  OT General Charges  $OT Visit 1 Visit  OT Evaluation  $OT Eval Low Complexity 1 Low  OT Treatments  $Self Care/Home Management  23-37 mins  Phillips Hay OTR/L Note entered by [facility] OT/L

## 2020-12-26 ENCOUNTER — Other Ambulatory Visit: Payer: Self-pay | Admitting: Specialist

## 2020-12-26 ENCOUNTER — Ambulatory Visit (HOSPITAL_COMMUNITY)
Admission: RE | Admit: 2020-12-26 | Discharge: 2020-12-26 | Disposition: A | Discharge status: Home / Self Care | Payer: Medicare Other | Source: Ambulatory Visit | Attending: Specialist | Admitting: Specialist

## 2020-12-26 ENCOUNTER — Other Ambulatory Visit (HOSPITAL_COMMUNITY): Payer: Self-pay | Admitting: Specialist

## 2020-12-26 ENCOUNTER — Other Ambulatory Visit: Payer: Self-pay

## 2020-12-26 DIAGNOSIS — S0990XA Unspecified injury of head, initial encounter: Secondary | ICD-10-CM | POA: Insufficient documentation

## 2021-11-04 IMAGING — CT CT HEAD W/O CM
4 series · 16 of 47 positions shown, 18 images · non-contrast
Comparison: None.

CLINICAL DATA: Fall, headache

EXAM:
CT HEAD WITHOUT CONTRAST
TECHNIQUE: Contiguous axial images were obtained from the base of the skull
through the vertex without intravenous contrast.

[Series 2: head wo · axial · 0.44mm/px · z∈[-100,+15]mm · 7 of 31 slices shown, 9 images]
[im 4/31  brain]
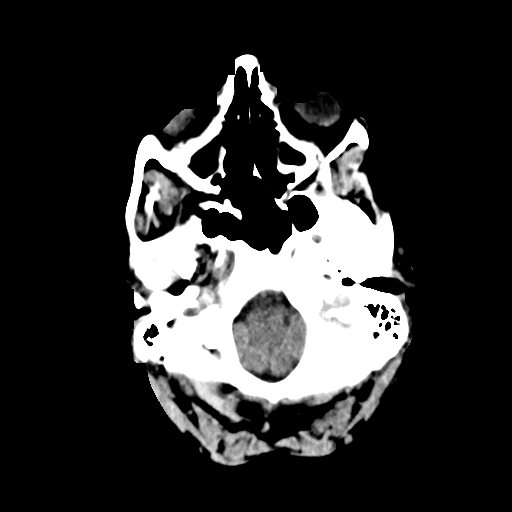
[im 4/31  bone]
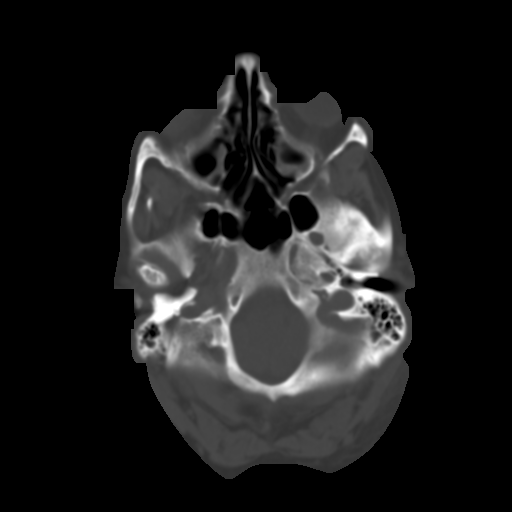
[im 8/31  brain]
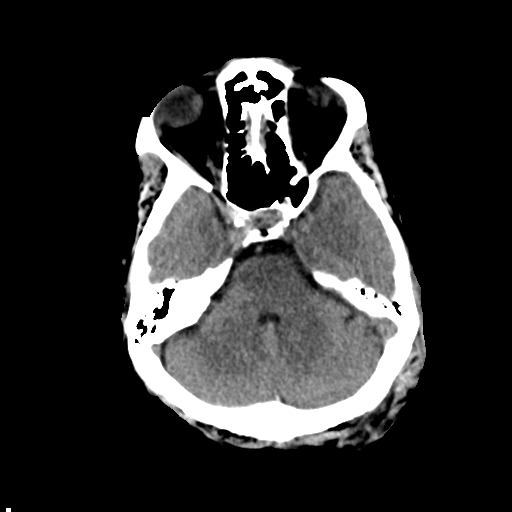
[im 12/31  brain]
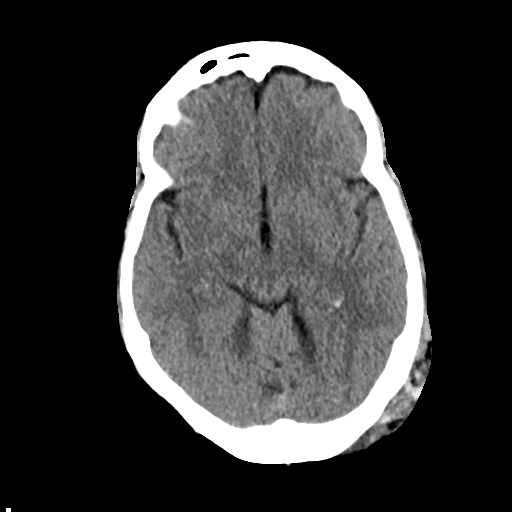
[im 16/31  brain]
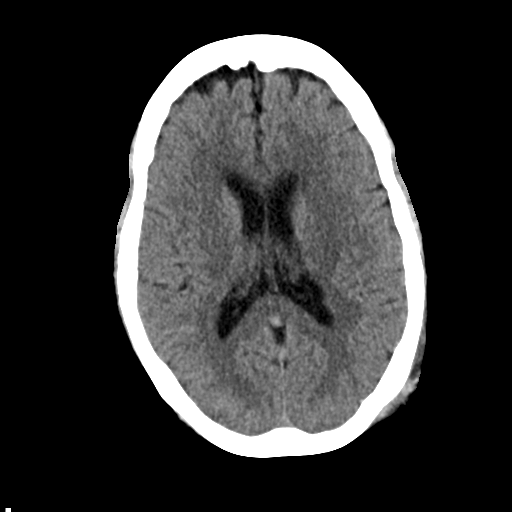
[im 19/31  brain]
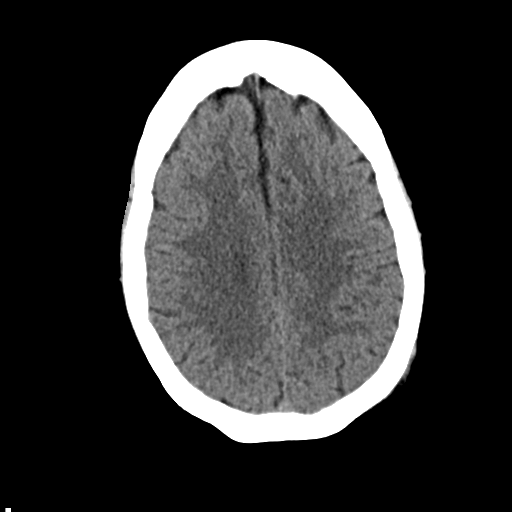
[im 19/31  bone]
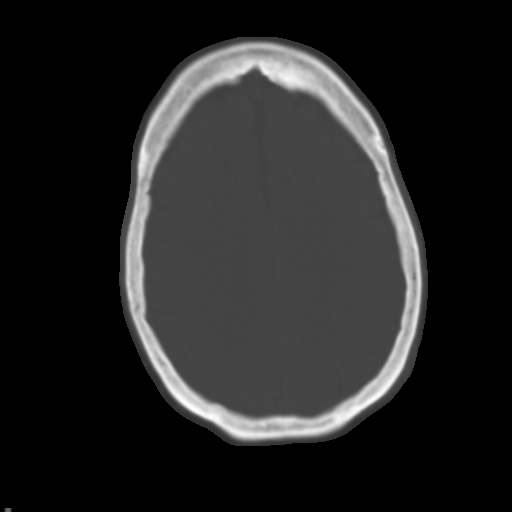
[im 23/31  brain]
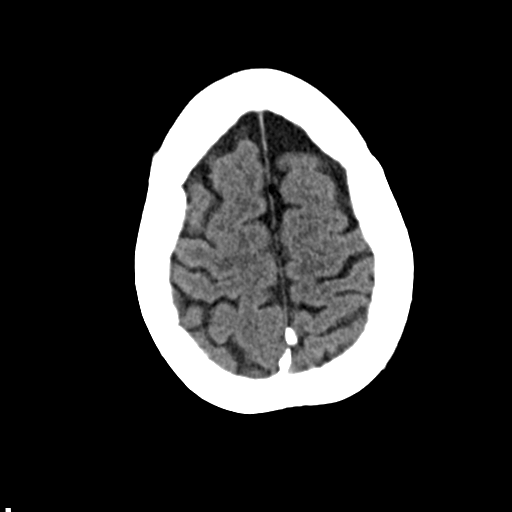
[im 27/31  brain]
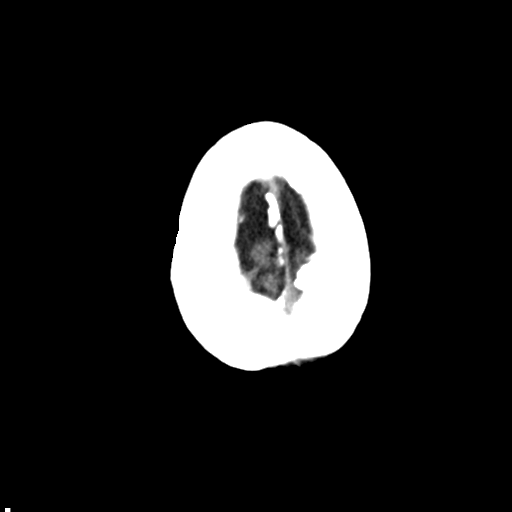

[Series 3: head bone · axial · 0.44mm/px · z∈[-101,-69]mm · 3 of 78 slices shown]
[im 8/78  bone]
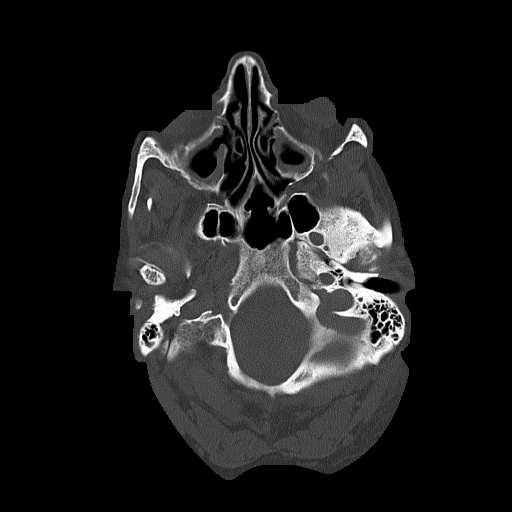
[im 16/78  bone]
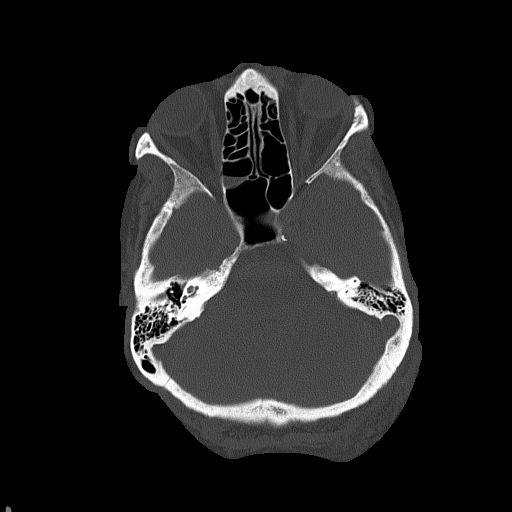
[im 24/78  bone]
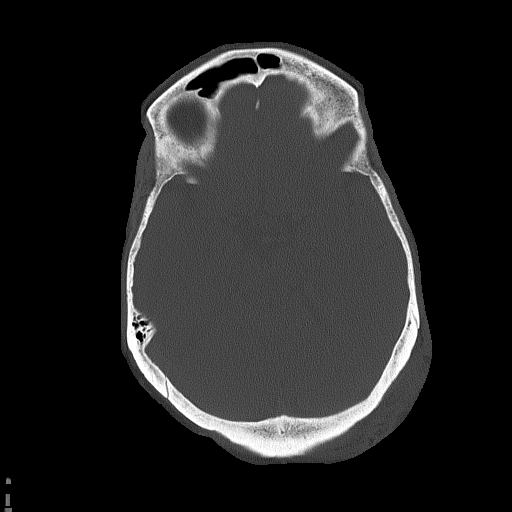

[Series 4: coronal soft tissue · coronal · 0.33mm/px · 3 of 67 slices shown]
[im 23/67  brain]
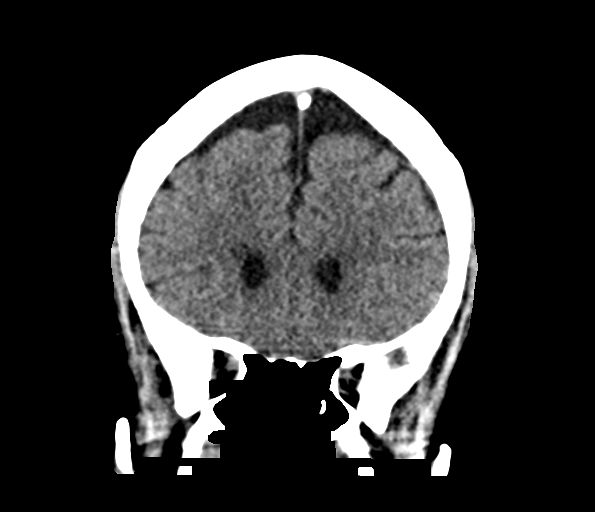
[im 30/67  brain]
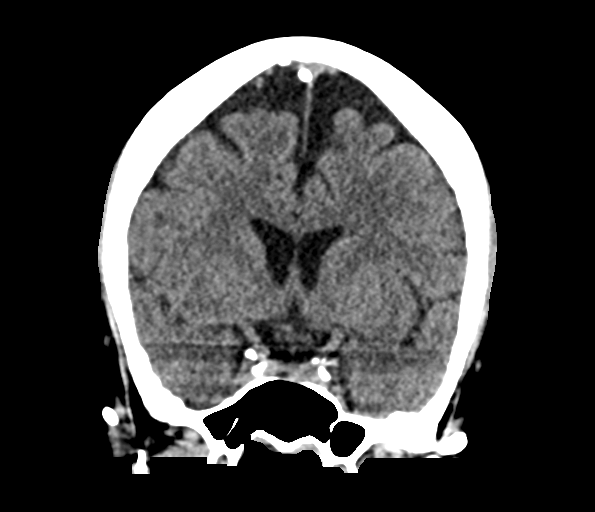
[im 37/67  brain]
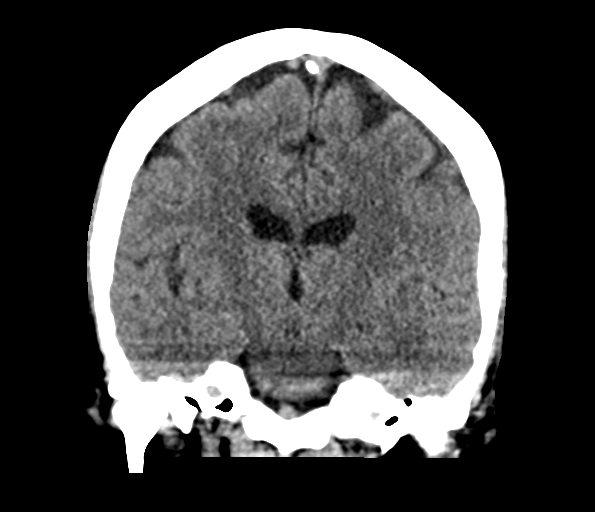

[Series 5: sagittal soft tissue · sagittal · 0.34mm/px · 3 of 67 slices shown]
[im 23/67  brain]
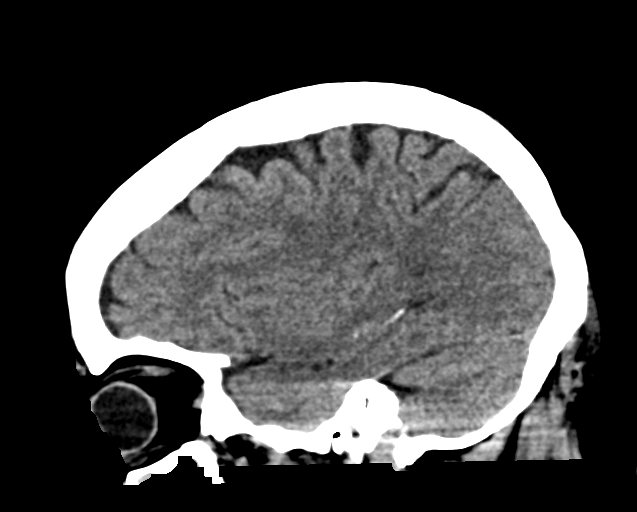
[im 34/67  brain]
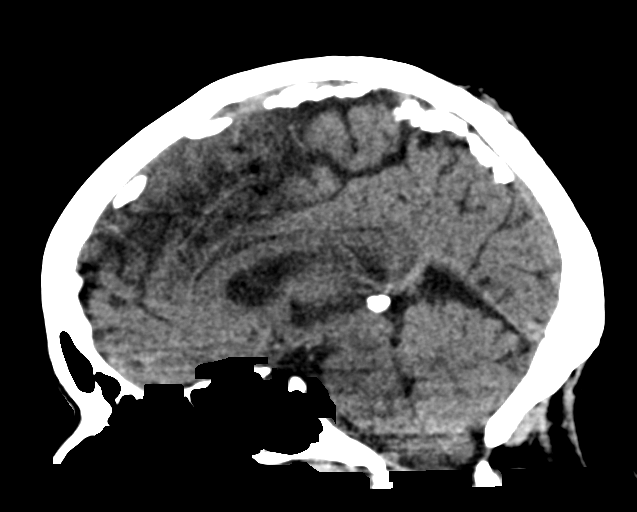
[im 45/67  brain]
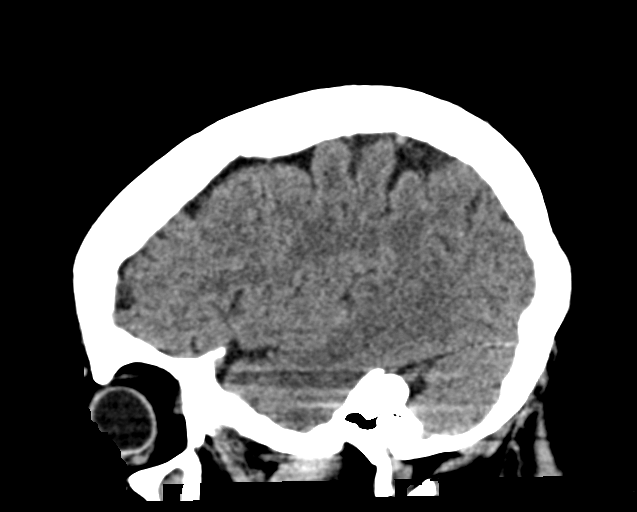

[16 of 47 positions shown; findings below may reference images not displayed]

FINDINGS: Brain: There is no evidence of acute intracranial hemorrhage,
extra-axial fluid collection, or acute infarct.

Parenchymal volume is normal. There is no significant burden of
chronic white matter microangiopathy. The ventricles are normal in
size. There is no mass lesion. There is no midline shift.

Vascular: There is calcification of the bilateral cavernous ICAs.

Skull: Normal. Negative for fracture or focal lesion.

Sinuses/Orbits: There is marked mucosal thickening in the bilateral
maxillary sinuses with mild surrounding hyperostosis. Bilateral lens
implants are in place. The globes and orbits are otherwise
unremarkable.

Other: There is a moderate size left parietal scalp hematoma. There
is degenerative change of the temporomandibular joints, left worse
than right.
IMPRESSION: 1. No acute intracranial hemorrhage.
2. Moderate left parietal scalp hematoma without underlying
calvarial fracture.
3. Chronic bilateral maxillary sinusitis.

## 2022-08-20 ENCOUNTER — Other Ambulatory Visit: Payer: Self-pay | Admitting: Physical Medicine and Rehabilitation

## 2022-08-20 DIAGNOSIS — M961 Postlaminectomy syndrome, not elsewhere classified: Secondary | ICD-10-CM

## 2022-09-03 NOTE — Discharge Instructions (Signed)

## 2022-09-04 ENCOUNTER — Ambulatory Visit
Admission: RE | Admit: 2022-09-04 | Discharge: 2022-09-04 | Disposition: A | Discharge status: Home / Self Care | Payer: Medicare Other | Source: Ambulatory Visit | Attending: Physical Medicine and Rehabilitation | Admitting: Physical Medicine and Rehabilitation

## 2022-09-04 ENCOUNTER — Inpatient Hospital Stay
Admission: RE | Admit: 2022-09-04 | Discharge: 2022-09-04 | Disposition: A | Discharge status: Home / Self Care | Payer: Self-pay | Source: Ambulatory Visit | Attending: Physical Medicine and Rehabilitation | Admitting: Physical Medicine and Rehabilitation

## 2022-09-04 ENCOUNTER — Other Ambulatory Visit: Payer: Self-pay | Admitting: Physical Medicine and Rehabilitation

## 2022-09-04 DIAGNOSIS — M961 Postlaminectomy syndrome, not elsewhere classified: Secondary | ICD-10-CM

## 2022-09-04 MED ORDER — IOPAMIDOL (ISOVUE-M 200) INJECTION 41%
15.0000 mL | Freq: Once | INTRAMUSCULAR | Status: AC
  Administered 2022-09-04: 15 mL via INTRATHECAL

## 2022-09-29 ENCOUNTER — Other Ambulatory Visit (HOSPITAL_COMMUNITY): Payer: Self-pay | Admitting: Specialist

## 2022-09-29 DIAGNOSIS — M5451 Vertebrogenic low back pain: Secondary | ICD-10-CM

## 2022-10-06 ENCOUNTER — Ambulatory Visit (HOSPITAL_COMMUNITY)
Admission: RE | Admit: 2022-10-06 | Discharge: 2022-10-06 | Disposition: A | Discharge status: Home / Self Care | Payer: Medicare Other | Source: Ambulatory Visit | Attending: Specialist | Admitting: Specialist

## 2022-10-06 DIAGNOSIS — M5451 Vertebrogenic low back pain: Secondary | ICD-10-CM | POA: Insufficient documentation

## 2022-10-07 ENCOUNTER — Other Ambulatory Visit: Payer: Self-pay | Admitting: Specialist

## 2022-10-07 DIAGNOSIS — M5451 Vertebrogenic low back pain: Secondary | ICD-10-CM

## 2022-10-08 LAB — VAS US ABI WITH/WO TBI
Left ABI: 1.12
Right ABI: 1.06

## 2022-12-08 ENCOUNTER — Other Ambulatory Visit: Payer: Self-pay | Admitting: Specialist

## 2022-12-08 DIAGNOSIS — M8580 Other specified disorders of bone density and structure, unspecified site: Secondary | ICD-10-CM

## 2023-02-02 ENCOUNTER — Other Ambulatory Visit: Payer: Self-pay | Admitting: Neurosurgery

## 2023-03-29 ENCOUNTER — Other Ambulatory Visit: Payer: Self-pay | Admitting: Neurosurgery

## 2023-03-30 NOTE — Progress Notes (Signed)
 Surgical Instructions   Your procedure is scheduled on April 07, 2023. Report to [facility] Main Entrance A at 6:30 A.M., then check in with the Admitting office. Any questions or running late day of surgery: call [phone]  Questions prior to your surgery date: call [phone], Monday-Friday, 8am-4pm. If you experience any cold or flu symptoms such as cough, fever, chills, shortness of breath, etc. between now and your scheduled surgery, please notify us  at the above number.     Remember:  Do not eat  or drink after midnight the night before your surgery      Take these medicines the morning of surgery with A SIP OF WATER  DULoxetine  (CYMBALTA )  ezetimibe  (ZETIA )  gabapentin  (NEURONTIN )  pantoprazole  (PROTONIX )  loratadine  (CLARITIN )  propranolol  (INDERAL )   May take these medicines IF NEEDED: acetaminophen  (TYLENOL )  albuterol  (VENTOLIN  HFA) inhaler MAY BRING WITH YOU  One week prior to surgery, STOP taking any Aspirin  (unless otherwise instructed by your surgeon) Aleve, Naproxen, Ibuprofen, Motrin, Advil, Goody's, BC's, all herbal medications, fish oil, and non-prescription vitamins. THIS INCLUDES YOUR celecoxib (CELEBREX).                      Do NOT Smoke (Tobacco/Vaping) for 24 hours prior to your procedure.  If you use a CPAP at night, you may bring your mask/headgear for your overnight stay.   You will be asked to remove any contacts, glasses, piercing's, hearing aid's, dentures/partials prior to surgery. Please bring cases for these items if needed.    Patients discharged the day of surgery will not be allowed to drive home, and someone needs to stay with them for 24 hours.  SURGICAL WAITING ROOM VISITATION Patients may have no more than 2 support people in the waiting area - these visitors may rotate.   Pre-op nurse will coordinate an appropriate time for 1 ADULT support person, who may not rotate, to accompany patient in pre-op.  Children under the age  of 66 must have an adult with them who is not the patient and must remain in the main waiting area with an adult.  If the patient needs to stay at the hospital during part of their recovery, the visitor guidelines for inpatient rooms apply.  Please refer to the [facility] website for the visitor guidelines for any additional information.   If you received a COVID test during your pre-op visit  it is requested that you wear a mask when out in public, stay away from anyone that may not be feeling well and notify your surgeon if you develop symptoms. If you have been in contact with anyone that has tested positive in the last 10 days please notify you surgeon.      Pre-operative 5 CHG Bathing Instructions   You can play a key role in reducing the risk of infection after surgery. Your skin needs to be as free of germs as possible. You can reduce the number of germs on your skin by washing with CHG (chlorhexidine  gluconate) soap before surgery. CHG is an antiseptic soap that kills germs and continues to kill germs even after washing.   DO NOT use if you have an allergy to chlorhexidine /CHG or antibacterial soaps. If your skin becomes reddened or irritated, stop using the CHG and notify one of our RNs at [phone].   Please shower with the CHG soap starting 4 days before surgery using the following schedule:     Please keep in mind the  following:  DO NOT shave, including legs and underarms, starting the day of your first shower.   You may shave your face at any point before/day of surgery.  Place clean sheets on your bed the day you start using CHG soap. Use a clean washcloth (not used since being washed) for each shower. DO NOT sleep with pets once you start using the CHG.   CHG Shower Instructions:  Wash your face and private area with normal soap. If you choose to wash your hair, wash first with your normal shampoo.  After you use shampoo/soap, rinse your hair and body thoroughly to  remove shampoo/soap residue.  Turn the water OFF and apply about 3 tablespoons (45 ml) of CHG soap to a CLEAN washcloth.  Apply CHG soap ONLY FROM YOUR NECK DOWN TO YOUR TOES (washing for 3-5 minutes)  DO NOT use CHG soap on face, private areas, open wounds, or sores.  Pay special attention to the area where your surgery is being performed.  If you are having back surgery, having someone wash your back for you may be helpful. Wait 2 minutes after CHG soap is applied, then you may rinse off the CHG soap.  Pat dry with a clean towel  Put on clean clothes/pajamas   If you choose to wear lotion, please use ONLY the CHG-compatible lotions that are listed below.  Additional instructions for the day of surgery: DO NOT APPLY any lotions, deodorants, cologne, or perfumes.   Do not bring valuables to the hospital. [facility] is not responsible for any belongings/valuables. Do not wear nail polish, gel polish, artificial nails, or any other type of covering on natural nails (fingers and toes) Do not wear jewelry or makeup Put on clean/comfortable clothes.  Please brush your teeth.  Ask your nurse before applying any prescription medications to the skin.     CHG Compatible Lotions   Aveeno Moisturizing lotion  Cetaphil Moisturizing Cream  Cetaphil Moisturizing Lotion  Clairol Herbal Essence Moisturizing Lotion, Dry Skin  Clairol Herbal Essence Moisturizing Lotion, Extra Dry Skin  Clairol Herbal Essence Moisturizing Lotion, Normal Skin  Curel Age Defying Therapeutic Moisturizing Lotion with Alpha Hydroxy  Curel Extreme Care Body Lotion  Curel Soothing Hands Moisturizing Hand Lotion  Curel Therapeutic Moisturizing Cream, Fragrance-Free  Curel Therapeutic Moisturizing Lotion, Fragrance-Free  Curel Therapeutic Moisturizing Lotion, Original Formula  Eucerin Daily Replenishing Lotion  Eucerin Dry Skin Therapy Plus Alpha Hydroxy Crme  Eucerin Dry Skin Therapy Plus Alpha Hydroxy Lotion  Eucerin  Original Crme  Eucerin Original Lotion  Eucerin Plus Crme Eucerin Plus Lotion  Eucerin TriLipid Replenishing Lotion  Keri Anti-Bacterial Hand Lotion  Keri Deep Conditioning Original Lotion Dry Skin Formula Softly Scented  Keri Deep Conditioning Original Lotion, Fragrance Free Sensitive Skin Formula  Keri Lotion Fast Absorbing Fragrance Free Sensitive Skin Formula  Keri Lotion Fast Absorbing Softly Scented Dry Skin Formula  Keri Original Lotion  Keri Skin Renewal Lotion Keri Silky Smooth Lotion  Keri Silky Smooth Sensitive Skin Lotion  Nivea Body Creamy Conditioning Oil  Nivea Body Extra Enriched Lotion  Nivea Body Original Lotion  Nivea Body Sheer Moisturizing Lotion Nivea Crme  Nivea Skin Firming Lotion  NutraDerm 30 Skin Lotion  NutraDerm Skin Lotion  NutraDerm Therapeutic Skin Cream  NutraDerm Therapeutic Skin Lotion  ProShield Protective Hand Cream  Provon moisturizing lotion  Please read over the following fact sheets that you were given.

## 2023-03-31 ENCOUNTER — Other Ambulatory Visit: Payer: Self-pay

## 2023-03-31 ENCOUNTER — Encounter (HOSPITAL_COMMUNITY)
Admission: RE | Admit: 2023-03-31 | Discharge: 2023-03-31 | Disposition: A | Discharge status: Home / Self Care | Payer: Medicare Other | Source: Ambulatory Visit | Attending: Physical Medicine and Rehabilitation | Admitting: Physical Medicine and Rehabilitation

## 2023-03-31 ENCOUNTER — Encounter (HOSPITAL_COMMUNITY): Payer: Self-pay

## 2023-03-31 VITALS — BP 152/74 | HR 70 | Temp 98.0°F | Resp 18 | Ht 67.0 in | Wt 184.9 lb

## 2023-03-31 DIAGNOSIS — M48062 Spinal stenosis, lumbar region with neurogenic claudication: Secondary | ICD-10-CM | POA: Insufficient documentation

## 2023-03-31 DIAGNOSIS — Z87891 Personal history of nicotine dependence: Secondary | ICD-10-CM | POA: Insufficient documentation

## 2023-03-31 DIAGNOSIS — K219 Gastro-esophageal reflux disease without esophagitis: Secondary | ICD-10-CM | POA: Diagnosis not present

## 2023-03-31 DIAGNOSIS — Z01818 Encounter for other preprocedural examination: Secondary | ICD-10-CM

## 2023-03-31 DIAGNOSIS — G4733 Obstructive sleep apnea (adult) (pediatric): Secondary | ICD-10-CM | POA: Diagnosis not present

## 2023-03-31 DIAGNOSIS — I10 Essential (primary) hypertension: Secondary | ICD-10-CM | POA: Diagnosis not present

## 2023-03-31 DIAGNOSIS — I2510 Atherosclerotic heart disease of native coronary artery without angina pectoris: Secondary | ICD-10-CM | POA: Insufficient documentation

## 2023-03-31 DIAGNOSIS — I252 Old myocardial infarction: Secondary | ICD-10-CM | POA: Insufficient documentation

## 2023-03-31 DIAGNOSIS — Z955 Presence of coronary angioplasty implant and graft: Secondary | ICD-10-CM | POA: Insufficient documentation

## 2023-03-31 DIAGNOSIS — E7800 Pure hypercholesterolemia, unspecified: Secondary | ICD-10-CM | POA: Diagnosis not present

## 2023-03-31 DIAGNOSIS — Z01812 Encounter for preprocedural laboratory examination: Secondary | ICD-10-CM | POA: Diagnosis present

## 2023-03-31 LAB — CBC
HCT: 41.7 % (ref 36.0–46.0)
Hemoglobin: 13.8 g/dL (ref 12.0–15.0)
MCH: 30.4 pg (ref 26.0–34.0)
MCHC: 33.1 g/dL (ref 30.0–36.0)
MCV: 91.9 fL (ref 80.0–100.0)
Platelets: 404 10*3/uL — ABNORMAL HIGH (ref 150–400)
RBC: 4.54 MIL/uL (ref 3.87–5.11)
RDW: 14 % (ref 11.5–15.5)
WBC: 10 10*3/uL (ref 4.0–10.5)
nRBC: 0 % (ref 0.0–0.2)

## 2023-03-31 LAB — SURGICAL PCR SCREEN
MRSA, PCR: NEGATIVE
Staphylococcus aureus: NEGATIVE

## 2023-03-31 LAB — BASIC METABOLIC PANEL
Anion gap: 10 (ref 5–15)
BUN: 19 mg/dL (ref 8–23)
CO2: 25 mmol/L (ref 22–32)
Calcium: 9 mg/dL (ref 8.9–10.3)
Chloride: 105 mmol/L (ref 98–111)
Creatinine, Ser: 0.74 mg/dL (ref 0.44–1.00)
GFR, Estimated: >60 mL/min (ref >60)
Glucose, Bld: 104 mg/dL — ABNORMAL HIGH (ref 70–99)
Potassium: 4.1 mmol/L (ref 3.5–5.1)
Sodium: 140 mmol/L (ref 135–145)

## 2023-03-31 LAB — TYPE AND SCREEN
ABO/RH(D): A POS
Antibody Screen: NEGATIVE

## 2023-03-31 NOTE — Progress Notes (Addendum)
 PCP - Lonza Charleston, MD   Cardiologist - Maude JINNY Morrow,   PPM/ICD -  denies Device Orders - n/a Rep Notified - n/a  Chest x-ray - denies EKG - requested from MD Office (cardiology) Stress Test - 20-17 ECHO - 2023 Cardiac Cath - 2010  Sleep Study - 15+ years ago CPAP - does not wear CPAP  DM -denies  Blood Thinner Instructions: denies Aspirin  Instructions: ASA patient instructed to follow up with surgeon  ERAS Protcol - NPO   COVID TEST- no   Anesthesia review: Yes, heart hx, MI, HTN, COPD InFLIXimab (REMICADE IV) Last Dose 03-03-23 methotrexate  Last dose 03-03-23 Patient denies shortness of breath, fever, cough and chest pain at PAT appointment   All instructions explained to the patient, with a verbal understanding of the material. Patient agrees to go over the instructions while at home for a better understanding. Patient also instructed to self quarantine after being tested for COVID-19. The opportunity to ask questions was provided.

## 2023-04-01 NOTE — Anesthesia Preprocedure Evaluation (Addendum)
 Anesthesia Evaluation  Patient identified by MRN, date of birth, ID band Patient awake    Reviewed: Allergy & Precautions, NPO status , Patient's Chart, lab work & pertinent test results  History of Anesthesia Complications Negative for: history of anesthetic complications  Airway Mallampati: II  TM Distance: >3 FB Neck ROM: Full    Dental  (+) Edentulous Lower, Edentulous Upper, Dental Advisory Given   Pulmonary sleep apnea , COPD,  COPD inhaler, former smoker   Pulmonary exam normal breath sounds clear to auscultation       Cardiovascular Exercise Tolerance: Good hypertension, Pt. on medications and Pt. on home beta blockers + CAD, + Past MI and + Cardiac Stents  + Valvular Problems/Murmurs MR  Rhythm:Regular Rate:Normal + Systolic murmurs Echo 05/08/2021 ([facility]): Left Ventricle: The systolic function is normal. The estimated ejection fraction is 55-60%. There is mild concentric LV hypertrophy visualized. There is Grade 1 diastolic dysfunction.    Right Ventricle: Normal systolic function is visualized.   Aortic Valve: There is no evidence of aortic stenosis. There is no evidence of aortic regurgitation. There is mild aortic valve calcification.    Mitral Valve: Mild mitral regurgitation is visualized.    Pericardium: There is no evidence of a pericardial effusion.    Nuclear stress 2017 low risk   Neuro/Psych  PSYCHIATRIC DISORDERS  Depression    Spinal stenosis L1-L5 negative neurological ROS     GI/Hepatic Neg liver ROS,GERD  Medicated and Controlled,,  Endo/Other  negative endocrine ROS    Renal/GU negative Renal ROS  negative genitourinary   Musculoskeletal  (+) Arthritis ,  psoriatic arthritis   Abdominal   Peds  Hematology  (+) Blood dyscrasia, anemia   Anesthesia Other Findings Day of surgery medications reviewed with patient.  Reproductive/Obstetrics negative OB ROS                              Anesthesia Physical Anesthesia Plan  ASA: 3  Anesthesia Plan: General   Post-op Pain Management: Tylenol  PO (pre-op)* and Gabapentin  PO (pre-op)*   Induction: Intravenous  PONV Risk Score and Plan: 4 or greater and Treatment may vary due to age or medical condition, Ondansetron , Dexamethasone  and Midazolam   Airway Management Planned: Oral ETT  Additional Equipment: None  Intra-op Plan:   Post-operative Plan: Extubation in OR  Informed Consent: I have reviewed the patients History and Physical, chart, labs and discussed the procedure including the risks, benefits and alternatives for the proposed anesthesia with the patient or authorized representative who has indicated his/her understanding and acceptance.     Dental advisory given  Plan Discussed with: CRNA  Anesthesia Plan Comments: (PAT note written 04/01/2023 by Allison Zelenak, PA-C.  )        Anesthesia Quick Evaluation

## 2023-04-01 NOTE — Progress Notes (Signed)
 Anesthesia Chart Review:  Case: [id number] Date/Time: 04/07/23 0816   Procedure: PLIF, IP, POSTERIOR INSTRUMENTATION, L23, L34   Anesthesia type: General   Pre-op diagnosis: LUMBAR STENOSIS WITH NEUROGENIC CLAUDICATION   [facility] ROOM 18 / [facility]   Surgeons: Mavis Purchase, MD       DISCUSSION: Patient is a 68 year old female scheduled for the above procedure.  History includes former smoker (quit 03/23/17), HTN, hypercholesterolemia, CAD (MI, s/p DES RCA 2010), OSA (does not use CPAP), GERD, idiopathic peripheral neuropathy, anemia, gastroparesis, right ear deafness, right foot drop, osteoarthritis (right TKA 07/08/09, left TKA 12/19/13), spinal surgery (L1-L5 decompression, foraminotomies 12/19/20).   She follows with cardiologist Dr. Vassallo at [facility] for history of CAD s/p MI treated with DES to RCA 2010.  She had negative nuclear stress test in 2017.  Echocardiogram in 2023 revealed normal LV function. Last seen on 03/29/23 for routine follow-up and preoperative evaluation.  She was asymptomatic, without exertional chest pain or shortness of breath.  Good LDL control.  He did recommend improved BP control (SBP 130-150) and continued Altace , Norvasc , propranolol .  He added, Patient is scheduled for back surgery and therefore should be low risk given her asymptomatic status.  Her EKG reveals normal sinus rhythm with no acute ST changes.  1 year follow-up planned.    She follows with [facility] rheumatologist Dr. Esperanza for history of psoriatic arthritis maintained on methotrexate IM weekly and Remicade IV Q 6 weeks. Per [facility], last Remicade was on 03/10/23.   Anesthesia team to evaluate on the day of surgery.    VS: BP (!) 152/74   Pulse 70   Temp 36.7 C (Oral)   Resp 18   Ht 5' 7 (1.702 m)   Wt 83.9 kg   SpO2 96%   BMI 28.96 kg/m   PROVIDERS: Lonza Charleston, MD is PCP  Sherren Coy, MD is cardiologist  Esperanza Mammie Ip, MD is  rheumatologist Payton Shove, MD is neurologist    LABS: Labs reviewed: Acceptable for surgery. (all labs ordered are listed, but only abnormal results are displayed)  Labs Reviewed  BASIC METABOLIC PANEL - Abnormal; Notable for the following components:      Result Value   Glucose, Bld 104 (*)    All other components within normal limits  CBC - Abnormal; Notable for the following components:   Platelets 404 (*)    All other components within normal limits  SURGICAL PCR SCREEN  TYPE AND SCREEN     IMAGES: CT Chest LCS 03/26/23 ([facility]): 1. No pulmonary nodules or masses.  2. No acute cardiopulmonary process.  Lung-RADS 1: Negative. Continue annual screening LDCT in 12 months if patient continues to meet screening criteria.   MRI L-spine 05/01/22 ([facility]): Interval surgical changes at L2 to L5 with improvement in degree of narrowing or residual degenerative changes as above.    EKG: 03/26/2022 ([facility]): Tracing scanned under Media tab, Correspondence, 04/01/23.  Normal sinus rhythm  Nonspecific ST and T wave abnormality  Abnormal ECG  When compared with ECG of 03-Apr-2021 13:42,  Nonspecific T wave abnormality now evident in Inferior leads  Confirmed by SHERREN COY ([id number]) on 03/26/2022 3:38:52 PM    CV: Echo 05/08/2021 ([facility]): Summary   Left Ventricle: The systolic function is normal. The estimated ejection fraction is 55-60%. There is mild concentric LV hypertrophy visualized. There is Grade 1 diastolic dysfunction.   Right Ventricle: Normal systolic function is visualized.  Aortic Valve: There is no evidence of aortic stenosis. There is no evidence of aortic regurgitation. There is mild aortic valve calcification.   Mitral Valve: Mild mitral regurgitation is visualized.   Pericardium: There is no evidence of a pericardial effusion.    US  Carotid 9/13/209 ([facility]): IMPRESSION: Mild plaque right carotid bulb. No hemodynamically  significant stenosis.    Nuclear stress 03/04/2016 ([facility]): 1. Negative ECG response to exercise/vasodilator stress.  2. Normal myocardial perfusion scan. No scintigraphic evidence of ischemia.  3. Normal left ventricular systolic function wall motion.  This represents a low risk study.     Per [facility] records: CARDIAC CATHETERIZATION 09/30/2008 Subtotal obstruction of the RCA with left to right collaterals. Significant inferior wall motion abnormality.  CORONARY ANGIOPLASTY WITH STENT PLACEMENT 09/30/2008 DES-Mid RCA    Past Medical History:  Diagnosis Date   Anemia    hx of years ago    Arthritis    osteoarthritis, psoriatic arthritis    Barrett's esophagus    CMV (cytomegalovirus infection) (HCC) 2001   COPD (chronic obstructive pulmonary disease) (HCC)    Coronary artery disease    Deaf, right 1992   Depression    Diverticulitis    Foot drop, right foot 2022   Gastroparesis    GERD (gastroesophageal reflux disease)    Hypercholesteremia    Hypertension    Myocardial infarction (HCC) 09/2008   Peripheral neuropathy    Psoriasis 1983   Seasonal allergies    Sleep apnea    cpap-    Urgency of urination     Past Surgical History:  Procedure Laterality Date   BLADDER SUSPENSION     CHOLECYSTECTOMY     CORONARY ANGIOPLASTY     with multiple Stents   DILATION AND CURETTAGE OF UTERUS  2012   EYE SURGERY Bilateral 2003   cataracts   JOINT REPLACEMENT     right knee - 2011    KNEE ARTHROSCOPY     right knee    LUMBAR LAMINECTOMY/DECOMPRESSION MICRODISCECTOMY N/A 12/19/2020   Procedure: Microlumbar decompression Lumbar One-Two, Lumbar Two-Three/Lumbar Three-Four/Lumbar Four-Five;  Surgeon: Duwayne Purchase, MD;  Location: [facility] OR;  Service: Orthopedics;  Laterality: N/A;   NASAL SINUS SURGERY     ROTATOR CUFF REPAIR Right 2017   TOTAL KNEE ARTHROPLASTY Left 12/19/2013   Procedure: LEFT TOTAL KNEE ARTHROPLASTY;  Surgeon: Donnice JONETTA Car, MD;   Location: [facility] ORS;  Service: Orthopedics;  Laterality: Left;   TUBAL LIGATION      MEDICATIONS:  amLODipine  (NORVASC ) 5 MG tablet   acetaminophen  (TYLENOL ) 500 MG tablet   albuterol  (VENTOLIN  HFA) 108 (90 Base) MCG/ACT inhaler   Ascorbic Acid  (VITAMIN C PO)   aspirin  EC 81 MG tablet   atorvastatin  (LIPITOR ) 80 MG tablet   B Complex-C (SUPER B COMPLEX PO)   celecoxib (CELEBREX) 200 MG capsule   Cholecalciferol (VITAMIN D) 50 MCG (2000 UT) tablet   DULoxetine  (CYMBALTA ) 60 MG capsule   ezetimibe  (ZETIA ) 10 MG tablet   ferrous sulfate  325 (65 FE) MG tablet   folic acid  (FOLVITE ) 1 MG tablet   gabapentin  (NEURONTIN ) 600 MG tablet   InFLIXimab (REMICADE IV)   loratadine  (CLARITIN ) 10 MG tablet   methotrexate 50 MG/2ML injection   pantoprazole  (PROTONIX ) 40 MG tablet   propranolol  (INDERAL ) 40 MG tablet   ramipril  (ALTACE ) 10 MG capsule   triamcinolone  cream (KENALOG ) 0.1 %   No current facility-administered medications for this encounter.   She was advised to follow-up  with surgeon regarding perioperative ASA instructions.   Isaiah Ruder, PA-C Surgical Short Stay/Anesthesiology [facility] Phone [phone] [facility] Phone [phone] 04/01/2023 4:29 PM

## 2023-04-07 ENCOUNTER — Inpatient Hospital Stay (HOSPITAL_COMMUNITY): Payer: Self-pay | Admitting: Vascular Surgery

## 2023-04-07 ENCOUNTER — Inpatient Hospital Stay (HOSPITAL_BASED_OUTPATIENT_CLINIC_OR_DEPARTMENT_OTHER): Payer: Self-pay | Admitting: Anesthesiology

## 2023-04-07 ENCOUNTER — Encounter (HOSPITAL_COMMUNITY)
Admission: RE | Disposition: A | Discharge status: Home / Self Care | Payer: Self-pay | Source: Home / Self Care | Attending: Neurosurgery

## 2023-04-07 ENCOUNTER — Inpatient Hospital Stay (HOSPITAL_COMMUNITY): Payer: Medicare Other

## 2023-04-07 ENCOUNTER — Observation Stay (HOSPITAL_COMMUNITY)
Admission: RE | Admit: 2023-04-07 | Discharge: 2023-04-08 | Disposition: A | Discharge status: Home / Self Care | Payer: Medicare Other | Attending: Neurosurgery | Admitting: Neurosurgery

## 2023-04-07 ENCOUNTER — Other Ambulatory Visit: Payer: Self-pay

## 2023-04-07 ENCOUNTER — Encounter (HOSPITAL_COMMUNITY): Payer: Self-pay | Admitting: Neurosurgery

## 2023-04-07 DIAGNOSIS — I10 Essential (primary) hypertension: Secondary | ICD-10-CM | POA: Insufficient documentation

## 2023-04-07 DIAGNOSIS — Z87891 Personal history of nicotine dependence: Secondary | ICD-10-CM

## 2023-04-07 DIAGNOSIS — M4186 Other forms of scoliosis, lumbar region: Secondary | ICD-10-CM | POA: Insufficient documentation

## 2023-04-07 DIAGNOSIS — Z96653 Presence of artificial knee joint, bilateral: Secondary | ICD-10-CM | POA: Diagnosis not present

## 2023-04-07 DIAGNOSIS — Z7982 Long term (current) use of aspirin: Secondary | ICD-10-CM | POA: Insufficient documentation

## 2023-04-07 DIAGNOSIS — M48062 Spinal stenosis, lumbar region with neurogenic claudication: Principal | ICD-10-CM | POA: Diagnosis present

## 2023-04-07 DIAGNOSIS — J449 Chronic obstructive pulmonary disease, unspecified: Secondary | ICD-10-CM | POA: Diagnosis not present

## 2023-04-07 DIAGNOSIS — I2510 Atherosclerotic heart disease of native coronary artery without angina pectoris: Secondary | ICD-10-CM | POA: Insufficient documentation

## 2023-04-07 DIAGNOSIS — Z79899 Other long term (current) drug therapy: Secondary | ICD-10-CM | POA: Diagnosis not present

## 2023-04-07 DIAGNOSIS — M5416 Radiculopathy, lumbar region: Secondary | ICD-10-CM | POA: Insufficient documentation

## 2023-04-07 HISTORY — PX: POSTERIOR FUSION PEDICLE SCREW PLACEMENT: SHX2186

## 2023-04-07 SURGERY — POSTERIOR LUMBAR FUSION 3 LEVEL
Anesthesia: General | Site: Spine Lumbar

## 2023-04-07 MED ORDER — RAMIPRIL 5 MG PO CAPS
10.0000 mg | ORAL_CAPSULE | Freq: Two times a day (BID) | ORAL | Status: DC
  Administered 2023-04-07 – 2023-04-08 (×2): 10 mg via ORAL
  Filled 2023-04-07 (×3): qty 2

## 2023-04-07 MED ORDER — THROMBIN 5000 UNITS EX SOLR: OROMUCOSAL | Status: DC | PRN

## 2023-04-07 MED ORDER — 0.9 % SODIUM CHLORIDE (POUR BTL) OPTIME
TOPICAL | Status: DC | PRN
  Administered 2023-04-07: 1000 mL

## 2023-04-07 MED ORDER — HYDROMORPHONE HCL 1 MG/ML IJ SOLN
INTRAMUSCULAR | Status: AC
  Filled 2023-04-07: qty 1

## 2023-04-07 MED ORDER — FENTANYL CITRATE (PF) 250 MCG/5ML IJ SOLN
INTRAMUSCULAR | Status: AC
  Filled 2023-04-07: qty 5

## 2023-04-07 MED ORDER — CHLORHEXIDINE GLUCONATE CLOTH 2 % EX PADS: 6.0000 | MEDICATED_PAD | Freq: Once | CUTANEOUS | Status: DC

## 2023-04-07 MED ORDER — ROCURONIUM BROMIDE 10 MG/ML (PF) SYRINGE
PREFILLED_SYRINGE | INTRAVENOUS | Status: DC | PRN
  Administered 2023-04-07: 30 mg via INTRAVENOUS
  Administered 2023-04-07: 40 mg via INTRAVENOUS
  Administered 2023-04-07 (×3): 30 mg via INTRAVENOUS

## 2023-04-07 MED ORDER — BUPIVACAINE-EPINEPHRINE (PF) 0.5% -1:200000 IJ SOLN
INTRAMUSCULAR | Status: AC
  Filled 2023-04-07: qty 30

## 2023-04-07 MED ORDER — MIDAZOLAM HCL 2 MG/2ML IJ SOLN
INTRAMUSCULAR | Status: DC | PRN
  Administered 2023-04-07: 2 mg via INTRAVENOUS

## 2023-04-07 MED ORDER — CYCLOBENZAPRINE HCL 10 MG PO TABS
10.0000 mg | ORAL_TABLET | Freq: Three times a day (TID) | ORAL | Status: DC | PRN
  Administered 2023-04-07 – 2023-04-08 (×2): 10 mg via ORAL
  Filled 2023-04-07 (×2): qty 1

## 2023-04-07 MED ORDER — MORPHINE SULFATE (PF) 2 MG/ML IV SOLN
2.0000 mg | INTRAVENOUS | Status: DC | PRN
Start: 2023-04-07 — End: 2023-04-08
  Administered 2023-04-07: 2 mg via INTRAVENOUS
  Filled 2023-04-07: qty 1

## 2023-04-07 MED ORDER — DROPERIDOL 2.5 MG/ML IJ SOLN
INTRAMUSCULAR | Status: AC
  Filled 2023-04-07: qty 2

## 2023-04-07 MED ORDER — ORAL CARE MOUTH RINSE: 15.0000 mL | Freq: Once | OROMUCOSAL | Status: AC

## 2023-04-07 MED ORDER — BUPIVACAINE LIPOSOME 1.3 % IJ SUSP
INTRAMUSCULAR | Status: AC
  Filled 2023-04-07: qty 20

## 2023-04-07 MED ORDER — ONDANSETRON HCL 4 MG/2ML IJ SOLN
INTRAMUSCULAR | Status: DC | PRN
  Administered 2023-04-07 (×2): 4 mg via INTRAVENOUS

## 2023-04-07 MED ORDER — THROMBIN 5000 UNITS EX SOLR
CUTANEOUS | Status: AC
  Filled 2023-04-07: qty 5000

## 2023-04-07 MED ORDER — OXYCODONE HCL 5 MG PO TABS: 5.0000 mg | ORAL_TABLET | ORAL | Status: DC | PRN

## 2023-04-07 MED ORDER — ACETAMINOPHEN 325 MG PO TABS: 650.0000 mg | ORAL_TABLET | ORAL | Status: DC | PRN

## 2023-04-07 MED ORDER — BISACODYL 5 MG PO TBEC
5.0000 mg | DELAYED_RELEASE_TABLET | Freq: Every day | ORAL | Status: DC | PRN
  Administered 2023-04-08: 5 mg via ORAL
  Filled 2023-04-07: qty 1

## 2023-04-07 MED ORDER — ZOLPIDEM TARTRATE 5 MG PO TABS
5.0000 mg | ORAL_TABLET | Freq: Every evening | ORAL | Status: DC | PRN
  Filled 2023-04-07: qty 1

## 2023-04-07 MED ORDER — ACETAMINOPHEN 650 MG RE SUPP: 650.0000 mg | RECTAL | Status: DC | PRN

## 2023-04-07 MED ORDER — DOCUSATE SODIUM 100 MG PO CAPS
100.0000 mg | ORAL_CAPSULE | Freq: Two times a day (BID) | ORAL | Status: DC
  Administered 2023-04-07 – 2023-04-08 (×2): 100 mg via ORAL
  Filled 2023-04-07 (×2): qty 1

## 2023-04-07 MED ORDER — PHENYLEPHRINE HCL-NACL 20-0.9 MG/250ML-% IV SOLN
INTRAVENOUS | Status: AC
  Filled 2023-04-07: qty 250

## 2023-04-07 MED ORDER — CHLORHEXIDINE GLUCONATE 0.12 % MT SOLN
15.0000 mL | Freq: Once | OROMUCOSAL | Status: AC
  Administered 2023-04-07: 15 mL via OROMUCOSAL
  Filled 2023-04-07: qty 15

## 2023-04-07 MED ORDER — EPHEDRINE SULFATE-NACL 50-0.9 MG/10ML-% IV SOSY
PREFILLED_SYRINGE | INTRAVENOUS | Status: DC | PRN
  Administered 2023-04-07: 10 mg via INTRAVENOUS
  Administered 2023-04-07 (×2): 5 mg via INTRAVENOUS

## 2023-04-07 MED ORDER — VASHE WOUND IRRIGATION OPTIME
TOPICAL | Status: DC | PRN
  Administered 2023-04-07: 34 [oz_av]

## 2023-04-07 MED ORDER — SODIUM CHLORIDE 0.9 % IV SOLN
250.0000 mL | INTRAVENOUS | Status: DC
  Administered 2023-04-07: 250 mL via INTRAVENOUS

## 2023-04-07 MED ORDER — BUPIVACAINE-EPINEPHRINE (PF) 0.5% -1:200000 IJ SOLN
INTRAMUSCULAR | Status: DC | PRN
  Administered 2023-04-07: 10 mL

## 2023-04-07 MED ORDER — OXYCODONE HCL 5 MG PO TABS
ORAL_TABLET | ORAL | Status: AC
  Filled 2023-04-07: qty 2

## 2023-04-07 MED ORDER — PHENYLEPHRINE HCL-NACL 20-0.9 MG/250ML-% IV SOLN: INTRAVENOUS | Status: DC | PRN

## 2023-04-07 MED ORDER — PROPOFOL 10 MG/ML IV BOLUS
INTRAVENOUS | Status: DC | PRN
  Administered 2023-04-07: 140 mg via INTRAVENOUS

## 2023-04-07 MED ORDER — ONDANSETRON HCL 4 MG/2ML IJ SOLN
INTRAMUSCULAR | Status: AC
  Filled 2023-04-07: qty 2

## 2023-04-07 MED ORDER — MIDAZOLAM HCL 2 MG/2ML IJ SOLN
INTRAMUSCULAR | Status: AC
  Filled 2023-04-07: qty 2

## 2023-04-07 MED ORDER — PHENYLEPHRINE 80 MCG/ML (10ML) SYRINGE FOR IV PUSH (FOR BLOOD PRESSURE SUPPORT)
PREFILLED_SYRINGE | INTRAVENOUS | Status: DC | PRN
  Administered 2023-04-07: 80 ug via INTRAVENOUS
  Administered 2023-04-07 (×2): 160 ug via INTRAVENOUS

## 2023-04-07 MED ORDER — ACETAMINOPHEN 500 MG PO TABS
1000.0000 mg | ORAL_TABLET | Freq: Once | ORAL | Status: AC
  Administered 2023-04-07: 1000 mg via ORAL
  Filled 2023-04-07: qty 2

## 2023-04-07 MED ORDER — FENTANYL CITRATE (PF) 250 MCG/5ML IJ SOLN
INTRAMUSCULAR | Status: DC | PRN
  Administered 2023-04-07: 50 ug via INTRAVENOUS
  Administered 2023-04-07: 100 ug via INTRAVENOUS
  Administered 2023-04-07: 50 ug via INTRAVENOUS
  Administered 2023-04-07 (×2): 25 ug via INTRAVENOUS

## 2023-04-07 MED ORDER — BACITRACIN ZINC 500 UNIT/GM EX OINT
TOPICAL_OINTMENT | CUTANEOUS | Status: DC | PRN
  Administered 2023-04-07: 1 via TOPICAL

## 2023-04-07 MED ORDER — CYCLOBENZAPRINE HCL 10 MG PO TABS
ORAL_TABLET | ORAL | Status: AC
  Filled 2023-04-07: qty 1

## 2023-04-07 MED ORDER — BUPIVACAINE LIPOSOME 1.3 % IJ SUSP
INTRAMUSCULAR | Status: DC | PRN
  Administered 2023-04-07: 20 mL

## 2023-04-07 MED ORDER — AMLODIPINE BESYLATE 5 MG PO TABS
5.0000 mg | ORAL_TABLET | Freq: Every day | ORAL | Status: DC
Start: 2023-04-08 — End: 2023-04-08
  Administered 2023-04-08: 5 mg via ORAL
  Filled 2023-04-07: qty 1

## 2023-04-07 MED ORDER — OXYCODONE HCL 5 MG PO TABS
10.0000 mg | ORAL_TABLET | ORAL | Status: DC | PRN
  Administered 2023-04-07 – 2023-04-08 (×6): 10 mg via ORAL
  Filled 2023-04-07 (×5): qty 2

## 2023-04-07 MED ORDER — ALBUTEROL SULFATE HFA 108 (90 BASE) MCG/ACT IN AERS
INHALATION_SPRAY | RESPIRATORY_TRACT | Status: DC | PRN
  Administered 2023-04-07: 6 via RESPIRATORY_TRACT

## 2023-04-07 MED ORDER — LIDOCAINE 2% (20 MG/ML) 5 ML SYRINGE
INTRAMUSCULAR | Status: DC | PRN
  Administered 2023-04-07: 60 mg via INTRAVENOUS

## 2023-04-07 MED ORDER — FOLIC ACID 1 MG PO TABS
1.0000 mg | ORAL_TABLET | Freq: Every day | ORAL | Status: DC
  Administered 2023-04-07 – 2023-04-08 (×2): 1 mg via ORAL
  Filled 2023-04-07 (×2): qty 1

## 2023-04-07 MED ORDER — LORATADINE 10 MG PO TABS
10.0000 mg | ORAL_TABLET | Freq: Every day | ORAL | Status: DC
  Administered 2023-04-08: 10 mg via ORAL
  Filled 2023-04-07: qty 1

## 2023-04-07 MED ORDER — HYDROMORPHONE HCL 1 MG/ML IJ SOLN
INTRAMUSCULAR | Status: AC
  Filled 2023-04-07: qty 0.5

## 2023-04-07 MED ORDER — PANTOPRAZOLE SODIUM 40 MG PO TBEC
40.0000 mg | DELAYED_RELEASE_TABLET | Freq: Two times a day (BID) | ORAL | Status: DC
  Administered 2023-04-07 – 2023-04-08 (×2): 40 mg via ORAL
  Filled 2023-04-07 (×2): qty 1

## 2023-04-07 MED ORDER — VANCOMYCIN HCL IN DEXTROSE 1-5 GM/200ML-% IV SOLN
1000.0000 mg | INTRAVENOUS | Status: AC
  Administered 2023-04-07: 1000 mg via INTRAVENOUS
  Filled 2023-04-07: qty 200

## 2023-04-07 MED ORDER — EZETIMIBE 10 MG PO TABS
10.0000 mg | ORAL_TABLET | Freq: Every morning | ORAL | Status: DC
  Administered 2023-04-08: 10 mg via ORAL
  Filled 2023-04-07: qty 1

## 2023-04-07 MED ORDER — GABAPENTIN 300 MG PO CAPS
300.0000 mg | ORAL_CAPSULE | Freq: Once | ORAL | Status: DC
  Filled 2023-04-07: qty 1

## 2023-04-07 MED ORDER — DROPERIDOL 2.5 MG/ML IJ SOLN
0.6250 mg | Freq: Once | INTRAMUSCULAR | Status: AC | PRN
  Administered 2023-04-07: 0.625 mg via INTRAVENOUS

## 2023-04-07 MED ORDER — ATORVASTATIN CALCIUM 80 MG PO TABS
80.0000 mg | ORAL_TABLET | Freq: Every day | ORAL | Status: DC
  Administered 2023-04-07: 80 mg via ORAL
  Filled 2023-04-07: qty 1

## 2023-04-07 MED ORDER — VANCOMYCIN HCL IN DEXTROSE 1-5 GM/200ML-% IV SOLN
1000.0000 mg | Freq: Once | INTRAVENOUS | Status: AC
  Administered 2023-04-07: 1000 mg via INTRAVENOUS
  Filled 2023-04-07: qty 200

## 2023-04-07 MED ORDER — DEXAMETHASONE SODIUM PHOSPHATE 10 MG/ML IJ SOLN
INTRAMUSCULAR | Status: DC | PRN
  Administered 2023-04-07: 10 mg via INTRAVENOUS

## 2023-04-07 MED ORDER — SODIUM CHLORIDE 0.9% FLUSH: 3.0000 mL | INTRAVENOUS | Status: DC | PRN

## 2023-04-07 MED ORDER — ONDANSETRON HCL 4 MG PO TABS: 4.0000 mg | ORAL_TABLET | Freq: Four times a day (QID) | ORAL | Status: DC | PRN

## 2023-04-07 MED ORDER — SUGAMMADEX SODIUM 200 MG/2ML IV SOLN
INTRAVENOUS | Status: DC | PRN
  Administered 2023-04-07: 166 mg via INTRAVENOUS

## 2023-04-07 MED ORDER — GABAPENTIN 300 MG PO CAPS
1200.0000 mg | ORAL_CAPSULE | Freq: Two times a day (BID) | ORAL | Status: DC
  Administered 2023-04-07 – 2023-04-08 (×2): 1200 mg via ORAL
  Filled 2023-04-07 (×2): qty 4

## 2023-04-07 MED ORDER — DULOXETINE HCL 30 MG PO CPEP
60.0000 mg | ORAL_CAPSULE | Freq: Two times a day (BID) | ORAL | Status: DC
  Administered 2023-04-07 – 2023-04-08 (×2): 60 mg via ORAL
  Filled 2023-04-07 (×2): qty 2

## 2023-04-07 MED ORDER — ONDANSETRON HCL 4 MG/2ML IJ SOLN
4.0000 mg | Freq: Four times a day (QID) | INTRAMUSCULAR | Status: DC | PRN
  Administered 2023-04-07: 4 mg via INTRAVENOUS
  Filled 2023-04-07: qty 2

## 2023-04-07 MED ORDER — MENTHOL 3 MG MT LOZG: 1.0000 | LOZENGE | OROMUCOSAL | Status: DC | PRN

## 2023-04-07 MED ORDER — BISACODYL 10 MG RE SUPP: 10.0000 mg | Freq: Every day | RECTAL | Status: DC | PRN

## 2023-04-07 MED ORDER — FERROUS SULFATE 325 (65 FE) MG PO TABS
325.0000 mg | ORAL_TABLET | Freq: Every day | ORAL | Status: DC
Start: 2023-04-08 — End: 2023-04-08
  Administered 2023-04-08: 325 mg via ORAL
  Filled 2023-04-07: qty 1

## 2023-04-07 MED ORDER — PHENOL 1.4 % MT LIQD: 1.0000 | OROMUCOSAL | Status: DC | PRN

## 2023-04-07 MED ORDER — PROPRANOLOL HCL 10 MG PO TABS
40.0000 mg | ORAL_TABLET | Freq: Two times a day (BID) | ORAL | Status: DC
  Administered 2023-04-07: 40 mg via ORAL
  Filled 2023-04-07: qty 4

## 2023-04-07 MED ORDER — HYDROMORPHONE HCL 1 MG/ML IJ SOLN
INTRAMUSCULAR | Status: DC | PRN
  Administered 2023-04-07: .3 mg via INTRAVENOUS

## 2023-04-07 MED ORDER — BACITRACIN ZINC 500 UNIT/GM EX OINT
TOPICAL_OINTMENT | CUTANEOUS | Status: AC
Start: 2023-04-07 — End: ?
  Filled 2023-04-07: qty 28.35

## 2023-04-07 MED ORDER — LACTATED RINGERS IV SOLN: INTRAVENOUS | Status: DC | PRN

## 2023-04-07 MED ORDER — SODIUM CHLORIDE 0.9% FLUSH: 3.0000 mL | Freq: Two times a day (BID) | INTRAVENOUS | Status: DC

## 2023-04-07 MED ORDER — PROPOFOL 10 MG/ML IV BOLUS
INTRAVENOUS | Status: AC
  Filled 2023-04-07: qty 20

## 2023-04-07 MED ORDER — ACETAMINOPHEN 500 MG PO TABS
1000.0000 mg | ORAL_TABLET | Freq: Four times a day (QID) | ORAL | Status: DC
  Administered 2023-04-07 – 2023-04-08 (×3): 1000 mg via ORAL
  Filled 2023-04-07 (×4): qty 2

## 2023-04-07 MED ORDER — ALBUTEROL SULFATE (2.5 MG/3ML) 0.083% IN NEBU: 2.5000 mg | INHALATION_SOLUTION | Freq: Four times a day (QID) | RESPIRATORY_TRACT | Status: DC | PRN

## 2023-04-07 MED ORDER — HYDROMORPHONE HCL 1 MG/ML IJ SOLN
0.2500 mg | INTRAMUSCULAR | Status: DC | PRN
  Administered 2023-04-07 (×4): 0.5 mg via INTRAVENOUS

## 2023-04-07 SURGICAL SUPPLY — 63 items
BAG COUNTER SPONGE SURGICOUNT (BAG) ×1 IMPLANT
BASKET BONE COLLECTION (BASKET) ×1 IMPLANT
BENZOIN TINCTURE PRP APPL 2/3 (GAUZE/BANDAGES/DRESSINGS) ×1 IMPLANT
BLADE CLIPPER SURG (BLADE) IMPLANT
BUR MATCHSTICK NEURO 3.0 LAGG (BURR) ×1 IMPLANT
BUR PRECISION FLUTE 6.0 (BURR) ×1 IMPLANT
CAGE SABLE 10X26 6-12 8D (Cage) IMPLANT
CANISTER SUCT 3000ML PPV (MISCELLANEOUS) ×1 IMPLANT
CAP LOCK DLX THRD (Cap) IMPLANT
CLEANSER WND VASHE INSTL 34OZ (WOUND CARE) ×1 IMPLANT
CNTNR URN SCR LID CUP LEK RST (MISCELLANEOUS) ×1 IMPLANT
CONNECTOR CROSS 6.0-6.35X48-60 (Connector) IMPLANT
COVER BACK TABLE 60X90IN (DRAPES) ×1 IMPLANT
DRAPE C-ARM 42X72 X-RAY (DRAPES) ×2 IMPLANT
DRAPE HALF SHEET 40X57 (DRAPES) ×1 IMPLANT
DRAPE LAPAROTOMY 100X72X124 (DRAPES) ×1 IMPLANT
DRAPE SURG 17X23 STRL (DRAPES) ×1 IMPLANT
DRSG OPSITE POSTOP 4X6 (GAUZE/BANDAGES/DRESSINGS) ×1 IMPLANT
DRSG OPSITE POSTOP 4X8 (GAUZE/BANDAGES/DRESSINGS) IMPLANT
DRSG TEGADERM 2-3/8X2-3/4 SM (GAUZE/BANDAGES/DRESSINGS) IMPLANT
ELECT BLADE 4.0 EZ CLEAN MEGAD (MISCELLANEOUS) ×1
ELECT REM PT RETURN 9FT ADLT (ELECTROSURGICAL) ×1
ELECTRODE BLDE 4.0 EZ CLN MEGD (MISCELLANEOUS) ×1 IMPLANT
ELECTRODE REM PT RTRN 9FT ADLT (ELECTROSURGICAL) ×1 IMPLANT
GAUZE 4X4 16PLY ~~LOC~~+RFID DBL (SPONGE) ×1 IMPLANT
GLOVE BIO SURGEON STRL SZ 6 (GLOVE) ×1 IMPLANT
GLOVE BIO SURGEON STRL SZ8 (GLOVE) ×2 IMPLANT
GLOVE BIO SURGEON STRL SZ8.5 (GLOVE) ×2 IMPLANT
GLOVE BIOGEL PI IND STRL 6.5 (GLOVE) ×1 IMPLANT
GLOVE EXAM NITRILE XL STR (GLOVE) IMPLANT
GOWN STRL REUS W/ TWL LRG LVL3 (GOWN DISPOSABLE) ×1 IMPLANT
GOWN STRL REUS W/ TWL XL LVL3 (GOWN DISPOSABLE) ×2 IMPLANT
GOWN STRL REUS W/TWL 2XL LVL3 (GOWN DISPOSABLE) IMPLANT
GRAFT TRINITY ELITE LGE HUMAN (Tissue) IMPLANT
HEMOSTAT POWDER KIT SURGIFOAM (HEMOSTASIS) ×1 IMPLANT
INTERBODY SABLE 10X26 7-14 15D (Miscellaneous) IMPLANT
KIT BASIN OR (CUSTOM PROCEDURE TRAY) ×1 IMPLANT
KIT GRAFTMAG DEL NEURO DISP (NEUROSURGERY SUPPLIES) IMPLANT
KIT POSITION SURG JACKSON T1 (MISCELLANEOUS) ×1 IMPLANT
KIT TURNOVER KIT B (KITS) ×1 IMPLANT
NDL HYPO 21X1.5 SAFETY (NEEDLE) ×1 IMPLANT
NDL HYPO 22X1.5 SAFETY MO (MISCELLANEOUS) ×1 IMPLANT
NEEDLE HYPO 21X1.5 SAFETY (NEEDLE) ×1
NEEDLE HYPO 22X1.5 SAFETY MO (MISCELLANEOUS) ×1
NS IRRIG 1000ML POUR BTL (IV SOLUTION) ×1 IMPLANT
PACK LAMINECTOMY NEURO (CUSTOM PROCEDURE TRAY) ×1 IMPLANT
PAD ARMBOARD 7.5X6 YLW CONV (MISCELLANEOUS) ×3 IMPLANT
PATTIES SURGICAL .5 X1 (DISPOSABLE) IMPLANT
PUTTY DBM 10CC CALC GRAN (Putty) IMPLANT
ROD CREO CRVD 6.35X100 (Rod) IMPLANT
SCREW PA DLX CREO 7.5X50 (Screw) IMPLANT
SPIKE FLUID TRANSFER (MISCELLANEOUS) ×1 IMPLANT
SPONGE NEURO XRAY DETECT 1X3 (DISPOSABLE) IMPLANT
SPONGE SURGIFOAM ABS GEL 100 (HEMOSTASIS) IMPLANT
SPONGE T-LAP 4X18 ~~LOC~~+RFID (SPONGE) IMPLANT
STRIP CLOSURE SKIN 1/2X4 (GAUZE/BANDAGES/DRESSINGS) ×1 IMPLANT
SUT VIC AB 1 CT1 18XBRD ANBCTR (SUTURE) ×2 IMPLANT
SUT VIC AB 2-0 CP2 18 (SUTURE) ×2 IMPLANT
SYR 20ML LL LF (SYRINGE) IMPLANT
TOWEL GREEN STERILE (TOWEL DISPOSABLE) ×1 IMPLANT
TOWEL GREEN STERILE FF (TOWEL DISPOSABLE) ×1 IMPLANT
TRAY FOLEY MTR SLVR 16FR STAT (SET/KITS/TRAYS/PACK) ×1 IMPLANT
WATER STERILE IRR 1000ML POUR (IV SOLUTION) ×1 IMPLANT

## 2023-04-07 NOTE — Anesthesia Procedure Notes (Signed)
 Procedure Name: Intubation Date/Time: 04/07/2023 6:45 PM  Performed by: Claybon Cuna, CRNAPre-anesthesia Checklist: Patient identified, Emergency Drugs available, Suction available and Patient being monitored Patient Re-evaluated:Patient Re-evaluated prior to induction Oxygen Delivery Method: Circle System Utilized Preoxygenation: Pre-oxygenation with 100% oxygen Induction Type: IV induction Ventilation: Mask ventilation without difficulty Laryngoscope Size: Mac and 3 Grade View: Grade I Tube type: Oral Tube size: 7.0 mm Number of attempts: 1 Airway Equipment and Method: Stylet and Oral airway Placement Confirmation: ETT inserted through vocal cords under direct vision, positive ETCO2 and breath sounds checked- equal and bilateral Tube secured with: Tape Dental Injury: Teeth and Oropharynx as per pre-operative assessment

## 2023-04-07 NOTE — Plan of Care (Signed)

## 2023-04-07 NOTE — H&P (Signed)
 Subjective: The patient is a 68 year old white female who is complaining of back and left greater right leg pain consistent with neurogenic claudication.  She failed medical management and was worked up with a lumbar MRI lumbar x-rays and lumbar mild CT which demonstrated multilevel degenerative changes, spinal stenosis, etc.  I discussed the various treatment options with her.  She has decided proceed with surgery.  Past Medical History:  Diagnosis Date   Anemia    hx of years ago    Arthritis    osteoarthritis, psoriatic arthritis    Barrett's esophagus    CMV (cytomegalovirus infection) (HCC) 2001   COPD (chronic obstructive pulmonary disease) (HCC)    Coronary artery disease    Deaf, right 1992   Depression    Diverticulitis    Foot drop, right foot 2022   Gastroparesis    GERD (gastroesophageal reflux disease)    Hypercholesteremia    Hypertension    Myocardial infarction (HCC) 09/2008   Peripheral neuropathy    Psoriasis 1983   Seasonal allergies    Sleep apnea    cpap-    Urgency of urination     Past Surgical History:  Procedure Laterality Date   BLADDER SUSPENSION     CHOLECYSTECTOMY     CORONARY ANGIOPLASTY     with multiple Stents   DILATION AND CURETTAGE OF UTERUS  2012   EYE SURGERY Bilateral 2003   cataracts   JOINT REPLACEMENT     right knee - 2011    KNEE ARTHROSCOPY     right knee    LUMBAR LAMINECTOMY/DECOMPRESSION MICRODISCECTOMY N/A 12/19/2020   Procedure: Microlumbar decompression Lumbar One-Two, Lumbar Two-Three/Lumbar Three-Four/Lumbar Four-Five;  Surgeon: Orvan Blanch, MD;  Location: [facility] OR;  Service: Orthopedics;  Laterality: N/A;   NASAL SINUS SURGERY     ROTATOR CUFF REPAIR Right 2017   TOTAL KNEE ARTHROPLASTY Left 12/19/2013   Procedure: LEFT TOTAL KNEE ARTHROPLASTY;  Surgeon: Bevin Bucks, MD;  Location: [facility];  Service: Orthopedics;  Laterality: Left;   TUBAL LIGATION      Allergies  Allergen Reactions   Codeine Itching and Nausea  Only   Golimumab Swelling   Integrilin [Eptifibatide]     Unknown reaction   Keflex [Cephalexin]     Unknown reaction    Lisinopril     Unknown reaction   Penicillins     Unknown reaction    Solifenacin Other (See Comments)    Other reaction(s): Other (See Comments) Caused blurred vision and "crusty lips" Vesicare    Social History   Tobacco Use   Smoking status: Former    Current packs/day: 0.00    Average packs/day: 1 pack/day for 42.0 years (42.0 ttl pk-yrs)    Types: Cigarettes    Start date: 70    Quit date: 2019    Years since quitting: 6.0   Smokeless tobacco: Never  Substance Use Topics   Alcohol use: Yes    Comment: rare    History reviewed. No pertinent family history. Prior to Admission medications   Medication Sig Start Date End Date Taking? Authorizing Provider  acetaminophen  (TYLENOL ) 500 MG tablet Take 1,000 mg by mouth every 6 (six) hours as needed for moderate pain.   Yes [provider]  albuterol  (VENTOLIN  HFA) 108 (90 Base) MCG/ACT inhaler Inhale 2 puffs into the lungs every 6 (six) hours as needed (COPD).   Yes [provider]  amLODipine  (NORVASC ) 5 MG tablet Take 5 mg by mouth daily.  Yes [provider]  Ascorbic Acid  (VITAMIN C PO) Take 2,000 mg by mouth daily.   Yes [provider]  aspirin  EC 81 MG tablet Take 81 mg by mouth daily. Swallow whole.   Yes [provider]  atorvastatin  (LIPITOR ) 80 MG tablet Take 80 mg by mouth at bedtime.   Yes [provider]  B Complex-C (SUPER B COMPLEX PO) Take 1 tablet by mouth daily.   Yes [provider]  celecoxib (CELEBREX) 200 MG capsule Take 200 mg by mouth in the morning.   Yes [provider]  Cholecalciferol (VITAMIN D) 50 MCG (2000 UT) tablet Take 2,000 Units by mouth daily.   Yes [provider]  DULoxetine  (CYMBALTA ) 60 MG capsule Take 60 mg by mouth 2 (two) times daily.   Yes [provider]  ezetimibe   (ZETIA ) 10 MG tablet Take 10 mg by mouth every morning. 08/25/22  Yes [provider]  ferrous sulfate  325 (65 FE) MG tablet Take 325 mg by mouth daily.   Yes [provider]  folic acid  (FOLVITE ) 1 MG tablet Take 1 mg by mouth daily.   Yes [provider]  gabapentin  (NEURONTIN ) 600 MG tablet Take 1,200 mg by mouth 2 (two) times daily.   Yes [provider]  InFLIXimab (REMICADE IV) Inject 1 each into the vein every 6 (six) weeks.   Yes [provider]  loratadine  (CLARITIN ) 10 MG tablet Take 10 mg by mouth daily.   Yes [provider]  methotrexate 50 MG/2ML injection Inject 10 mg into the muscle every Monday. 08/28/20  Yes [provider]  pantoprazole  (PROTONIX ) 40 MG tablet Take 40 mg by mouth 2 (two) times daily.   Yes [provider]  propranolol  (INDERAL ) 40 MG tablet Take 40 mg by mouth 2 (two) times daily.   Yes [provider]  ramipril  (ALTACE ) 10 MG capsule Take 10 mg by mouth 2 (two) times daily.   Yes [provider]  triamcinolone  cream (KENALOG ) 0.1 % Apply 1 application topically 2 (two) times daily as needed (psoriasis).    [provider]     Review of Systems  Positive ROS: As above  All other systems have been reviewed and were otherwise negative with the exception of those mentioned in the HPI and as above.  Objective: Vital signs in last 24 hours: Temp:  [98.1 F (36.7 C)] 98.1 F (36.7 C) (01/15 0648) Pulse Rate:  [75] 75 (01/15 0648) Resp:  [17] 17 (01/15 0648) BP: (147)/(79) 147/79 (01/15 0648) SpO2:  [94 %] 94 % (01/15 0648) Weight:  [83 kg] 83 kg (01/15 0648) Estimated body mass index is 28.66 kg/m as calculated from the following:   Height as of this encounter: 5\' 7"  (1.702 m).   Weight as of this encounter: 83 kg.   General Appearance: Alert Head: Normocephalic, without obvious abnormality, atraumatic Eyes: PERRL, conjunctiva/corneas clear, EOM's intact,     Ears: Normal  Throat: Normal  Neck: Supple, Back: unremarkable Lungs: Clear to auscultation bilaterally, respirations unlabored Heart: Regular rate and rhythm, no murmur, rub or gallop Abdomen: Soft, non-tender Extremities: Extremities normal, atraumatic, no cyanosis or edema Skin: unremarkable  NEUROLOGIC:   Mental status: alert and oriented,Motor Exam - grossly normal Sensory Exam - grossly normal Reflexes:  Coordination - grossly normal Gait - grossly normal Balance - grossly normal Cranial Nerves: I: smell Not tested  II: visual acuity  OS: Normal  OD: Normal   II: visual fields  Full to confrontation  II: pupils Equal, round, reactive to light  III,VII: ptosis None  III,IV,VI: extraocular muscles  Full ROM  V: mastication Normal  V: facial light touch sensation  Normal  V,VII: corneal reflex  Present  VII: facial muscle function - upper  Normal  VII: facial muscle function - lower Normal  VIII: hearing Not tested  IX: soft palate elevation  Normal  IX,X: gag reflex Present  XI: trapezius strength  5/5  XI: sternocleidomastoid strength 5/5  XI: neck flexion strength  5/5  XII: tongue strength  Normal    Data Review Lab Results  Component Value Date   WBC 10.0 03/31/2023   HGB 13.8 03/31/2023   HCT 41.7 03/31/2023   MCV 91.9 03/31/2023   PLT 404 (H) 03/31/2023   Lab Results  Component Value Date   NA 140 03/31/2023   K 4.1 03/31/2023   CL 105 03/31/2023   CO2 25 03/31/2023   BUN 19 03/31/2023   CREATININE 0.74 03/31/2023   GLUCOSE 104 (H) 03/31/2023   Lab Results  Component Value Date   INR 1.03 12/13/2013    Assessment/Plan: Lumbar degenerative disease, spondylosis, stenosis, lumbago, neurogenic claudication: I have discussed the situation with the patient.  I reviewed her imaging studies with her and pointed out the abnormalities.  We have discussed the various treatment options including surgery.  I described the surgical treatment option of an  L2-3, L3-4 and L4-5 decompression, instrumentation and fusion.  I have shown her surgical models.  I have given her surgical pamphlet.  We have discussed the risk, benefits, alternatives, expected postoperative course, and likelihood of achieving our goals with surgery.  I have answered all her questions.  She has decided to proceed with surgery.   Elder Greening 04/07/2023 8:26 AM

## 2023-04-07 NOTE — Progress Notes (Signed)
 Orthopedic Tech Progress Note Patient Details:  Kriss E Lites Apr 21, 1955 [id number]  Ortho Devices Type of Ortho Device: Lumbar corsett Ortho Device/Splint Location: BACK Ortho Device/Splint Interventions: Ordered   Post Interventions Patient Tolerated: Well Instructions Provided: Care of device  Kermitt Pedlar 04/07/2023, 4:33 PM

## 2023-04-07 NOTE — Op Note (Signed)
 Brief history: The patient is a 68 year old white female whose had a previous multilevel lumbar laminectomy by another physician.  She has had persistent back and bilateral leg pain.  She failed medical management and was worked up with a lumbar MRI, lumbar x-rays and lumbar myelo CT.  This demonstrated multilevel degenerative changes, stenosis, etc.  I discussed the various treatment options with the patient.  She has decided proceed with surgery.  Preoperative diagnosis: Adult degenerative scoliosis, lumbar degenerative disc disease, spinal stenosis compressing the 2, L3, L4 and L5 nerve roots; lumbago; lumbar radiculopathy; neurogenic claudication  Postoperative diagnosis: The same  Procedure: Bilateral redo L2-3, L3-4 and L4-5 laminectomy/foraminotomies/medial facetectomy to decompress the bilateral L2, L3, L4 and L5 nerve roots(the work required to do this was in addition to the work required to do the posterior lumbar interbody fusion because of the patient's spinal stenosis, facet arthropathy. Etc. requiring a wide decompression of the nerve roots.); left L2-3, L3-4 and L4-5 transforaminal lumbar interbody fusion with local morselized autograft bone and Zimmer DBM; insertion of interbody prosthesis at L2-3, L3-4 and L4-5 (globus peek expandable interbody prosthesis); posterior segmental instrumentation from L2 to L5 with globus titanium pedicle screws and rods; posterior lateral arthrodesis at L2-3, L3-4 and L4-5 with local morselized autograft bone and Zimmer DBM.  Surgeon: Dr. Pleasant Brilliant  Asst.: Dr. Audie Bleacher and Marlana Silvan, NP  Anesthesia: Gen. endotracheal  Estimated blood loss: 300 cc  Drains: 1 medium Hemovac drain in the epidural space.  Complications: None  Description of procedure: The patient was brought to the operating room by the anesthesia team. General endotracheal anesthesia was induced. The patient was turned to the prone position on the Wilson frame. The patient's  lumbosacral region was then prepared with Betadine scrub and Betadine solution. Sterile drapes were applied.  I then injected the area to be incised with Marcaine  with epinephrine  solution. I then used the scalpel to make a linear midline incision over the L2-3, L3-4 and L4-5 interspace, incising through the old scar tissue. I then used electrocautery to perform a bilateral subperiosteal dissection exposing the spinous process and lamina of L2-3, L3-4 and L4-5. We then obtained intraoperative radiograph to confirm our location. We then inserted the Verstrac retractor to provide exposure.  I began the decompression by using the high speed drill to widen the bilateral laminectomies at L2-3, L3-4 and L4-5.  As expected we encountered quite a bit of epidural scar tissue.  We then used the Kerrison punches to widen the laminotomy and removed the epidural scar tissue and the remainder of the the ligamentum flavum at L2-3, L3-4 and L4-5. We used the Kerrison punches to remove the medial facets at L2-3, L3-4 and L4-5, we removed the left L2, L3 and L4-5 facets. We performed wide foraminotomies about the bilateral L2, L3, L4 and L5 nerve roots completing the decompression.  We now turned our attention to the posterior lumbar interbody fusion. I used a scalpel to incise the intervertebral disc at L2-3, L3-4 and L4-5.  The disc base was quite collapsed at L2-3 and L3-4.. I then performed a partial intervertebral discectomy at L2-3, L3-4 and L4-5 bilaterally using the pituitary forceps. We prepared the vertebral endplates at L2-3, L3-4 and L4-5 bilaterally for the fusion by removing the soft tissues with the curettes. We then used the trial spacers to pick the appropriate sized interbody prosthesis. We prefilled his prosthesis with a combination of local morselized autograft bone that we obtained during the decompression as well  as Zimmer DBM. We inserted the prefilled prosthesis into the interspace at L2-3, L3-4 and L4-5  bilaterally. There was a good snug fit of the prosthesis in the interspace. We then filled and the remainder of the intervertebral disc space with local morselized autograft bone and Zimmer DBM. This completed the posterior lumbar interbody arthrodesis.  During the decompression and insertion of the prosthesis the assistant protected the thecal sac and nerve roots with the D'Errico retractor.  We now turned attention to the instrumentation. Under fluoroscopic guidance we cannulated the bilateral L2, L3, L4 and L5 pedicles with the bone probe. We then removed the bone probe. We then tapped the pedicle with a 6.5 millimeter tap. We then removed the tap. We probed inside the tapped pedicle with a ball probe to rule out cortical breaches. We then inserted a 7.5 x 50 millimeter pedicle screw into the L2, L3, L4 and L5 pedicles bilaterally under fluoroscopic guidance. We then palpated along the medial aspect of the pedicles to rule out cortical breaches. There were none. The nerve roots were not injured. We then connected the unilateral pedicle screws with a lordotic rod. We compressed the construct and secured the rod in place with the caps.  We placed a cross connector between the rods.  We then tightened the caps appropriately. This completed the instrumentation from L2-L5 bilaterally.  We now turned our attention to the posterior lateral arthrodesis at L2-3, L3-4 and L4-5. We used the high-speed drill to decorticate the remainder of the facets, pars, transverse process at L2-3, L3-4 and L4-5. We then applied a combination of local morselized autograft bone and Zimmer DBM over these decorticated posterior lateral structures. This completed the posterior lateral arthrodesis.  We then obtained hemostasis using bipolar electrocautery. We irrigated the wound out with vashe solution. We inspected the thecal sac and nerve roots and noted they were well decompressed. We then removed the retractor.  We injected Exparel  .   We placed a medium Hemovac drain in the epidural space and tunneled it out through a separate stab wound.  We reapproximated patient's thoracolumbar fascia with interrupted #1 Vicryl suture. We reapproximated patient's subcutaneous tissue with interrupted 2-0 Vicryl suture. The reapproximated patient's skin with Steri-Strips and benzoin. The wound was then coated with bacitracin  ointment. A sterile dressing was applied. The drapes were removed. The patient was subsequently returned to the supine position where they were extubated by the anesthesia team. He was then transported to the post anesthesia care unit in stable condition. All sponge instrument and needle counts were reportedly correct at the end of this case.

## 2023-04-07 NOTE — Anesthesia Procedure Notes (Signed)
 Procedure Name: Intubation Date/Time: 04/07/2023 8:45 AM  Performed by: Claybon Cuna, CRNAPre-anesthesia Checklist: Patient identified, Emergency Drugs available, Suction available and Patient being monitored Patient Re-evaluated:Patient Re-evaluated prior to induction Oxygen Delivery Method: Circle System Utilized Preoxygenation: Pre-oxygenation with 100% oxygen Induction Type: IV induction Ventilation: Mask ventilation without difficulty Laryngoscope Size: Mac and 3 Grade View: Grade I Tube type: Oral Tube size: 7.0 mm Number of attempts: 1 Airway Equipment and Method: Stylet and Oral airway Placement Confirmation: ETT inserted through vocal cords under direct vision, positive ETCO2 and breath sounds checked- equal and bilateral Tube secured with: Tape Dental Injury: Teeth and Oropharynx as per pre-operative assessment

## 2023-04-07 NOTE — Transfer of Care (Signed)
 Immediate Anesthesia Transfer of Care Note  Patient: Kayla Bailey  Procedure(s) Performed: LUMBAR TWO-THREE, LUMBAR THREE-FOUR, LUMBAR FOUR-FIVE POSTERIOR LUMBAR INTERBODY FUSION (Spine Lumbar)  Patient Location: PACU  Anesthesia Type:General  Level of Consciousness: drowsy  Airway & Oxygen Therapy: Patient Spontanous Breathing and Patient connected to face mask oxygen  Post-op Assessment: Report given to RN and Post -op Vital signs reviewed and stable  Post vital signs: Reviewed and stable  Last Vitals:  Vitals Value Taken Time  BP 129/86 04/07/23 1556  Temp    Pulse 96 04/07/23 1559  Resp 16 04/07/23 1559  SpO2 93 % 04/07/23 1559  Vitals shown include unfiled device data.  Last Pain:  Vitals:   04/07/23 0700  TempSrc:   PainSc: 6       Patients Stated Pain Goal: 0 (04/07/23 0700)  Complications: No notable events documented.

## 2023-04-08 DIAGNOSIS — M48062 Spinal stenosis, lumbar region with neurogenic claudication: Secondary | ICD-10-CM | POA: Diagnosis present

## 2023-04-08 MED ORDER — OXYCODONE-ACETAMINOPHEN 5-325 MG PO TABS: 1.0000 | ORAL_TABLET | ORAL | 0 refills | Status: AC | PRN

## 2023-04-08 MED ORDER — DOCUSATE SODIUM 100 MG PO CAPS: 100.0000 mg | ORAL_CAPSULE | Freq: Two times a day (BID) | ORAL | 0 refills | Status: AC

## 2023-04-08 MED ORDER — CYCLOBENZAPRINE HCL 10 MG PO TABS: 10.0000 mg | ORAL_TABLET | Freq: Three times a day (TID) | ORAL | 0 refills | Status: AC | PRN

## 2023-04-08 MED FILL — Thrombin For Soln 5000 Unit: CUTANEOUS | Qty: 5000 | Status: AC

## 2023-04-08 NOTE — Anesthesia Postprocedure Evaluation (Signed)
Anesthesia Post Note  Patient: Kristinia E Lindor  Procedure(s) Performed: LUMBAR TWO-THREE, LUMBAR THREE-FOUR, LUMBAR FOUR-FIVE POSTERIOR LUMBAR INTERBODY FUSION (Spine Lumbar)     Patient location during evaluation: PACU Anesthesia Type: General Level of consciousness: sedated and patient cooperative Pain management: pain level controlled Vital Signs Assessment: post-procedure vital signs reviewed and stable Respiratory status: spontaneous breathing Cardiovascular status: stable Anesthetic complications: no   No notable events documented.  Last Vitals:  Vitals:   04/08/23 0312 04/08/23 0727  BP: (!) 162/82 (!) 150/75  Pulse: 88 76  Resp: 20 20  Temp: 36.5 C 36.6 C  SpO2: 91% 96%    Last Pain:  Vitals:   04/08/23 0727  TempSrc: Oral  PainSc:                  Lewie Loron

## 2023-04-08 NOTE — Progress Notes (Signed)
Subjective: The patient is alert and pleasant.  She looks well.  She is complaining of some pain in her right buttock.  She thinks she wants to go home later on today after she works with physical therapy.  Objective: Vital signs in last 24 hours: Temp:  [97 F (36.1 C)-98.1 F (36.7 C)] 97.8 F (36.6 C) (01/16 0727) Pulse Rate:  [72-102] 76 (01/16 0727) Resp:  [12-21] 20 (01/16 0727) BP: (129-182)/(68-93) 150/75 (01/16 0727) SpO2:  [91 %-96 %] 96 % (01/16 0727) Estimated body mass index is 28.66 kg/m as calculated from the following:   Height as of this encounter: 5\' 7"  (1.702 m).   Weight as of this encounter: 83 kg.   Intake/Output from previous day: 01/15 0701 - 01/16 0700 In: 2720 [P.O.:720; I.V.:2000] Out: 1100 [Urine:425; Drains:475; Blood:200] Intake/Output this shift: No intake/output data recorded.  Physical exam the patient is alert and pleasant.  She looks well.  She is in no apparent distress.  Her strength is grossly normal.  Lab Results: No results for input(s): "WBC", "HGB", "HCT", "PLT" in the last 72 hours. BMET No results for input(s): "NA", "K", "CL", "CO2", "GLUCOSE", "BUN", "CREATININE", "CALCIUM" in the last 72 hours.  Studies/Results: DG Lumbar Spine 2-3 Views Result Date: 04/07/2023 CLINICAL DATA:  Elective surgery. EXAM: LUMBAR SPINE - 2-3 VIEW COMPARISON:  None Available. FINDINGS: Two portable cross-table lateral views of the lumbar spine obtained in the operating room. Film 1 demonstrates surgical instrument posteriorly at the L4 level. Film 2 demonstrates surgical instruments posteriorly at the L4-L5 level. IMPRESSION: Intraoperative localization during lumbar surgery. Electronically Signed   By: Narda Rutherford M.D.   On: 04/07/2023 17:14   DG Lumbar Spine 2-3 Views Result Date: 04/07/2023 CLINICAL DATA:  Elective surgery. EXAM: LUMBAR SPINE - 2-3 VIEW COMPARISON:  CT 09/04/2022 FINDINGS: Two fluoroscopic spot views of the lumbar spine obtained in  the operating room. Pedicle screws at L2, L3, L4, and L5 with interbody spacers. Fluoroscopy time 32.5 seconds. Dose 22.02 mGy IMPRESSION: Intraoperative fluoroscopy during lumbar fusion. Electronically Signed   By: Narda Rutherford M.D.   On: 04/07/2023 15:08   DG C-Arm 1-60 Min-No Report Result Date: 04/07/2023 Fluoroscopy was utilized by the requesting physician.  No radiographic interpretation.   DG C-Arm 1-60 Min-No Report Result Date: 04/07/2023 Fluoroscopy was utilized by the requesting physician.  No radiographic interpretation.   DG C-Arm 1-60 Min-No Report Result Date: 04/07/2023 Fluoroscopy was utilized by the requesting physician.  No radiographic interpretation.    Assessment/Plan: Postop day #1: The patient is doing well.  She might go home after she works with PT.  I gave her discharge instructions and answered all questions.  Will plan to remove the JP just before she leaves.  LOS: 1 day     Cristi Loron 04/08/2023, 7:42 AM     Patient ID: Kayla Bailey, female   DOB: 11-23-1955, 68 y.o.   MRN: [id number]

## 2023-04-08 NOTE — Evaluation (Signed)
Physical Therapy Evaluation Patient Details Name: Kayla Bailey MRN: [id number] DOB: [birth year] Today's Date: 04/08/2023  History of Present Illness  The pt is a 68 yo female presenting 1/15 for bilateral redo of L2-3, L3-4, and L4-5  laminectomy/foraminotomies/medial facetectomy and TLIF. 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   Clinical Impression  Pt in bed upon arrival of PT, agreeable to evaluation at this time. Prior to admission the pt was completely independent without need for DME and reports no recent falls. The pt lives with her spouse in a home with 5 steps to enter (also has a ramp option), has some DME available, and is hopeful to return to full mobility without pain. The pt was able to complete bed mobility with cues for log roll, sit-stand transfers, hallway ambulation, and stairs with use of RW and CGA-supervision. Cues for R ankle DF and step clearance to reduce risk of falls due to chronic R foot drop, and cues for posture were given with gait as well. Will continue to benefit from skilled PT to address LE strength, power, and dynamic stability. Pt educated on spinal precautions, use of brace, progressive walking program, and car transfers with pt reporting understanding.     If plan is discharge home, recommend the following: Help with stairs or ramp for entrance   Can travel by private vehicle        Equipment Recommendations Rolling walker (2 wheels)  Recommendations for Other Services       Functional Status Assessment Patient has had a recent decline in their functional status and demonstrates the ability to make significant improvements in function in a reasonable and predictable amount of time.     Precautions / Restrictions Precautions Precautions: Back Precaution Booklet Issued: Yes (comment) Precaution Comments: reviewed Required Braces or Orthoses:  Spinal Brace Spinal Brace: Lumbar corset;Applied in sitting position Restrictions Weight Bearing Restrictions Per Provider Order: No      Mobility  Bed Mobility Overal bed mobility: Needs Assistance Bed Mobility: Sidelying to Sit   Sidelying to sit: Supervision       General bed mobility comments: VCs for technique    Transfers Overall transfer level: Modified independent Equipment used: Rolling walker (2 wheels)               General transfer comment: with and without RW, no assistance or instability    Ambulation/Gait Ambulation/Gait assistance: Contact guard assist Gait Distance (Feet): 350 Feet Assistive device: Rolling walker (2 wheels) Gait Pattern/deviations: Step-through pattern, Decreased stride length, Decreased dorsiflexion - right Gait velocity: decreased Gait velocity interpretation: <1.31 ft/sec, indicative of household ambulator   General Gait Details: pt with mild trunk flexionand cues for increased clearance RLE. mild instability but more stable with RW  Stairs Stairs: Yes Stairs assistance: Contact guard assist Stair Management: One rail Right, Step to pattern, Forwards Number of Stairs: 4 General stair comments: CGA for safety, cues for up with LLE and down with RLE       Balance Overall balance assessment: Mild deficits observed, not formally tested                                           Pertinent Vitals/Pain Pain Assessment Pain Assessment: Faces Faces Pain Scale: Hurts a little bit Pain Location: incisional Pain Descriptors /  Indicators: Sore Pain Intervention(s): Limited activity within patient's tolerance, Monitored during session, Repositioned    Home Living Family/patient expects to be discharged to:: Private residence Living Arrangements: Spouse/significant other Available Help at Discharge: Family;Available 24 hours/day Type of Home: House Home Access: Ramped entrance       Home Layout: One  level Home Equipment: BSC/3in1;Shower seat      Prior Function Prior Level of Function : Independent/Modified Independent;Driving             Mobility Comments: no falls, no use of DME       Extremity/Trunk Assessment   Upper Extremity Assessment Upper Extremity Assessment: Defer to OT evaluation    Lower Extremity Assessment Lower Extremity Assessment: Overall WFL for tasks assessed (mild limitations due to pain, chronic R foot drop with R ankle DF 3+/5)    Cervical / Trunk Assessment Cervical / Trunk Assessment: Normal;Back Surgery  Communication   Communication Communication: No apparent difficulties Cueing Techniques: Verbal cues  Cognition Arousal: Alert Behavior During Therapy: WFL for tasks assessed/performed Overall Cognitive Status: Within Functional Limits for tasks assessed                                          General Comments General comments (skin integrity, edema, etc.): drain intact        Assessment/Plan    PT Assessment Patient needs continued PT services  PT Problem List Decreased activity tolerance;Decreased balance;Decreased mobility;Pain       PT Treatment Interventions DME instruction;Gait training;Stair training;Functional mobility training;Therapeutic activities;Therapeutic exercise;Balance training;Patient/family education    PT Goals (Current goals can be found in the Care Plan section)  Acute Rehab PT Goals Patient Stated Goal: return home PT Goal Formulation: With patient Time For Goal Achievement: 04/22/23 Potential to Achieve Goals: Good    Frequency Min 5X/week        AM-PAC PT "6 Clicks" Mobility  Outcome Measure Help needed turning from your back to your side while in a flat bed without using bedrails?: A Little Help needed moving from lying on your back to sitting on the side of a flat bed without using bedrails?: A Little Help needed moving to and from a bed to a chair (including a  wheelchair)?: None Help needed standing up from a chair using your arms (e.g., wheelchair or bedside chair)?: None Help needed to walk in hospital room?: A Little Help needed climbing 3-5 steps with a railing? : A Little 6 Click Score: 20    End of Session Equipment Utilized During Treatment: Gait belt;Back brace Activity Tolerance: Patient tolerated treatment well Patient left: with call bell/phone within reach;in chair Nurse Communication: Mobility status PT Visit Diagnosis: Unsteadiness on feet (R26.81);Other abnormalities of gait and mobility (R26.89);Muscle weakness (generalized) (M62.81);Pain Pain - Right/Left: Right Pain - part of body: Hip (back)    Time: 7253-6644 PT Time Calculation (min) (ACUTE ONLY): 25 min   Charges:   PT Evaluation $PT Eval Low Complexity: 1 Low PT Treatments $Therapeutic Exercise: 8-22 mins PT General Charges $$ ACUTE PT VISIT: 1 Visit         Vickki Muff, PT, DPT   Acute Rehabilitation Department Office [phone] Secure Chat Communication Preferred  Ronnie Derby 04/08/2023, 10:30 AM

## 2023-04-08 NOTE — Discharge Summary (Signed)
Physician Discharge Summary     Providing Compassionate, Quality Care - Together   Patient ID: Kayla Bailey MRN: [id number] DOB/AGE: [birth year] 68 y.o.  Admit date: 04/07/2023 Discharge date: 04/08/2023  Admission Diagnoses:  Discharge Diagnoses:  Principal Problem:   Spinal stenosis of lumbar region with neurogenic claudication Active Problems:   Spinal stenosis, lumbar region with neurogenic claudication   Discharged Condition: good  Hospital Course: Patient underwent an L2-3, L3-4, L4-5 TLIF  by Dr. Lovell Sheehan on 04/07/2023. She was admitted to 3C04  following recovery from anesthesia in the PACU. Her postoperative course has been uncomplicated. She has worked with both physical and occupational therapies who feel the patient is ready for discharge home. She is ambulating independently with the aid of a walker. She is tolerating a normal diet. She is not having any bowel or bladder dysfunction. Her pain is well-controlled with oral pain medication. She is ready for discharge home.   Consults: PT/OT/TOC  Significant Diagnostic Studies: radiology: DG Lumbar Spine 2-3 Views Result Date: 04/07/2023 CLINICAL DATA:  Elective surgery. EXAM: LUMBAR SPINE - 2-3 VIEW COMPARISON:  None Available. FINDINGS: Two portable cross-table lateral views of the lumbar spine obtained in the operating room. Film 1 demonstrates surgical instrument posteriorly at the L4 level. Film 2 demonstrates surgical instruments posteriorly at the L4-L5 level. IMPRESSION: Intraoperative localization during lumbar surgery. Electronically Signed   By: Narda Rutherford M.D.   On: 04/07/2023 17:14   DG Lumbar Spine 2-3 Views Result Date: 04/07/2023 CLINICAL DATA:  Elective surgery. EXAM: LUMBAR SPINE - 2-3 VIEW COMPARISON:  CT 09/04/2022 FINDINGS: Two fluoroscopic spot views of the lumbar spine obtained in the operating room. Pedicle screws at L2, L3, L4, and L5 with interbody spacers. Fluoroscopy time 32.5 seconds. Dose  22.02 mGy IMPRESSION: Intraoperative fluoroscopy during lumbar fusion. Electronically Signed   By: Narda Rutherford M.D.   On: 04/07/2023 15:08   DG C-Arm 1-60 Min-No Report Result Date: 04/07/2023 Fluoroscopy was utilized by the requesting physician.  No radiographic interpretation.   DG C-Arm 1-60 Min-No Report Result Date: 04/07/2023 Fluoroscopy was utilized by the requesting physician.  No radiographic interpretation.   DG C-Arm 1-60 Min-No Report Result Date: 04/07/2023 Fluoroscopy was utilized by the requesting physician.  No radiographic interpretation.     Treatments: surgery: Bilateral redo L2-3, L3-4 and L4-5 laminectomy/foraminotomies/medial facetectomy to decompress the bilateral L2, L3, L4 and L5 nerve roots(the work required to do this was in addition to the work required to do the posterior lumbar interbody fusion because of the patient's spinal stenosis, facet arthropathy. Etc. requiring a wide decompression of the nerve roots.); left L2-3, L3-4 and L4-5 transforaminal lumbar interbody fusion with local morselized autograft bone and Zimmer DBM; insertion of interbody prosthesis at L2-3, L3-4 and L4-5 (globus peek expandable interbody prosthesis); posterior segmental instrumentation from L2 to L5 with globus titanium pedicle screws and rods; posterior lateral arthrodesis at L2-3, L3-4 and L4-5 with local morselized autograft bone and Zimmer DBM.   Discharge Exam: Blood pressure (!) 150/75, pulse 76, temperature 97.8 F (36.6 C), temperature source Oral, resp. rate 20, height 5\' 7"  (1.702 m), weight 83 kg, SpO2 96%.  Per report: Alert and oriented x 4 PERRLA CN II-XII grossly intact MAE, Strength and sensation intact Incision is covered with Honeycomb dressing and Steri Strips; Dressing is clean, dry, and intact   Disposition: Discharge disposition: 01-Home or Self Care       Discharge Instructions     Call MD for:  difficulty  breathing, headache or visual  disturbances   Complete by: As directed    Call MD for:  hives   Complete by: As directed    Call MD for:  persistant nausea and vomiting   Complete by: As directed    Call MD for:  redness, tenderness, or signs of infection (pain, swelling, redness, odor or green/yellow discharge around incision site)   Complete by: As directed    Call MD for:  severe uncontrolled pain   Complete by: As directed    Diet - low sodium heart healthy   Complete by: As directed    If the dressing is still on your incision site when you go home, remove it on the third day after your surgery date. Remove dressing if it begins to fall off, or if it is dirty or damaged before the third day.   Complete by: As directed    Increase activity slowly   Complete by: As directed       Allergies as of 04/08/2023       Reactions   Codeine Itching, Nausea Only   Golimumab Swelling   Integrilin [eptifibatide]    Unknown reaction   Keflex [cephalexin]    Unknown reaction    Lisinopril    Unknown reaction   Penicillins    Unknown reaction   Solifenacin Other (See Comments)   Other reaction(s): Other (See Comments) Caused blurred vision and "crusty lips" Vesicare        Medication List     PAUSE taking these medications    celecoxib 200 MG capsule Wait to take this until: July 06, 2023 Commonly known as: CELEBREX Take 200 mg by mouth in the morning.   methotrexate 50 MG/2ML injection Wait to take this until: April 30, 2023 Inject 10 mg into the muscle every Monday.       TAKE these medications    acetaminophen 500 MG tablet Commonly known as: TYLENOL Take 1,000 mg by mouth every 6 (six) hours as needed for moderate pain.   albuterol 108 (90 Base) MCG/ACT inhaler Commonly known as: VENTOLIN HFA Inhale 2 puffs into the lungs every 6 (six) hours as needed (COPD).   amLODipine 5 MG tablet Commonly known as: NORVASC Take 5 mg by mouth daily.   aspirin EC 81 MG tablet Take 81 mg by mouth  daily. Swallow whole.   atorvastatin 80 MG tablet Commonly known as: LIPITOR Take 80 mg by mouth at bedtime.   cyclobenzaprine 10 MG tablet Commonly known as: FLEXERIL Take 1 tablet (10 mg total) by mouth 3 (three) times daily as needed for muscle spasms.   docusate sodium 100 MG capsule Commonly known as: COLACE Take 1 capsule (100 mg total) by mouth 2 (two) times daily.   DULoxetine 60 MG capsule Commonly known as: CYMBALTA Take 60 mg by mouth 2 (two) times daily.   ezetimibe 10 MG tablet Commonly known as: ZETIA Take 10 mg by mouth every morning.   ferrous sulfate 325 (65 FE) MG tablet Take 325 mg by mouth daily.   folic acid 1 MG tablet Commonly known as: FOLVITE Take 1 mg by mouth daily.   gabapentin 600 MG tablet Commonly known as: NEURONTIN Take 1,200 mg by mouth 2 (two) times daily.   loratadine 10 MG tablet Commonly known as: CLARITIN Take 10 mg by mouth daily.   oxyCODONE-acetaminophen 5-325 MG tablet Commonly known as: Percocet Take 1-2 tablets by mouth every 4 (four) hours as needed for severe pain (  pain score 7-10).   pantoprazole 40 MG tablet Commonly known as: PROTONIX Take 40 mg by mouth 2 (two) times daily.   propranolol 40 MG tablet Commonly known as: INDERAL Take 40 mg by mouth 2 (two) times daily.   ramipril 10 MG capsule Commonly known as: ALTACE Take 10 mg by mouth 2 (two) times daily.   REMICADE IV Inject 1 each into the vein every 6 (six) weeks.   SUPER B COMPLEX PO Take 1 tablet by mouth daily.   triamcinolone cream 0.1 % Commonly known as: KENALOG Apply 1 application topically 2 (two) times daily as needed (psoriasis).   VITAMIN C PO Take 2,000 mg by mouth daily.   Vitamin D 50 MCG (2000 UT) tablet Take 2,000 Units by mouth daily.               Discharge Care Instructions  (From admission, onward)           Start     Ordered   04/08/23 0000  If the dressing is still on your incision site when you go home,  remove it on the third day after your surgery date. Remove dressing if it begins to fall off, or if it is dirty or damaged before the third day.        04/08/23 1242            Follow-up Information     Tressie Stalker, MD. Go on 04/30/2023.   Specialty: Neurosurgery Why: First post op appointment is on 04/29/2022 at 2:00 PM. Contact information: [address] [place] [address] [phone]                 Signed: Val Eagle, DNP, AGNP-C Nurse Practitioner  [facility] [address], [place], [place] [address] P: [phone]    F: [phone]  04/08/2023, 12:42 PM

## 2023-04-08 NOTE — Care Management CC44 (Signed)
Condition Code 44 Documentation Completed  Patient Details  Name: ABBEGALE ROUGEAU MRN: [id number] Date of Birth: 04/28/1955   Condition Code 44 given:  Yes Patient signature on Condition Code 44 notice:  Yes Documentation of 2 MD's agreement:  Yes Code 44 added to claim:  Yes    Kermit Balo, RN 04/08/2023, 1:09 PM

## 2023-04-08 NOTE — Care Management Obs Status (Signed)
MEDICARE OBSERVATION STATUS NOTIFICATION   Patient Details  Name: Kayla Bailey MRN: [id number] Date of Birth: [birth year]   Medicare Observation Status Notification Given:  Yes    Kermit Balo, RN 04/08/2023, 1:09 PM

## 2023-04-08 NOTE — Discharge Instructions (Signed)

## 2023-04-08 NOTE — Progress Notes (Signed)
Patient alert and oriented, void, ambulate. Surgical site clean and dry no sign of infection. D/c instructions explain and given to the patient and family. All questions answered.

## 2023-04-08 NOTE — Plan of Care (Signed)
  Problem: Education: Goal: Ability to verbalize activity precautions or restrictions will improve Outcome: Completed/Met Goal: Knowledge of the prescribed therapeutic regimen will improve Outcome: Completed/Met Goal: Understanding of discharge needs will improve Outcome: Completed/Met   Problem: Activity: Goal: Ability to avoid complications of mobility impairment will improve Outcome: Completed/Met Goal: Ability to tolerate increased activity will improve Outcome: Completed/Met Goal: Will remain free from falls Outcome: Completed/Met   Problem: Bowel/Gastric: Goal: Gastrointestinal status for postoperative course will improve Outcome: Completed/Met   Problem: Pain Management: Goal: Pain level will decrease Outcome: Completed/Met   Problem: Bladder/Genitourinary: Goal: Urinary functional status for postoperative course will improve Outcome: Completed/Met

## 2023-04-08 NOTE — Evaluation (Signed)
Occupational Therapy Evaluation and Discharge Patient Details Name: Kayla Bailey MRN: [id number] DOB: 1956/03/03 Today's Date: 04/08/2023   History of Present Illness Pt is a 68 yo female s/p bilateral redo L2-3, L3-4 and L4-5 laminectomy/foraminotomies/medial facetectomy to decompress the bilateral L2, L3, L4 and L5 nerve roots and left L2-3, L3-4 and L4-5 transforaminal lumbar interbody fusion. PHMx: microlumbar decompression at L1-L5 with foraminotomies at L3-5 bilaterally on 12/19/2020 due to right foot drop.   Clinical Impression   This 68 yo female admitted and underwent above presents to acute OT with all education completed and post op back surgery handout provided and information on it covered. No further OT needs, we will sign off.       If plan is discharge home, recommend the following: A lot of help with bathing/dressing/bathroom;Assistance with cooking/housework;Assist for transportation    Functional Status Assessment  Patient has had a recent decline in their functional status and demonstrates the ability to make significant improvements in function in a reasonable and predictable amount of time. (without further need for skilled OT)  Equipment Recommendations  None recommended by OT       Precautions / Restrictions Precautions Precautions: Back Precaution Booklet Issued: Yes (comment) Required Braces or Orthoses: Spinal Brace Spinal Brace: Lumbar corset;Applied in sitting position Restrictions Weight Bearing Restrictions Per Provider Order: No      Mobility Bed Mobility Overal bed mobility: Needs Assistance Bed Mobility: Rolling, Sidelying to Sit, Sit to Sidelying Rolling: Supervision Sidelying to sit: Supervision     Sit to sidelying: Supervision General bed mobility comments: VCs for technique    Transfers Overall transfer level: Modified independent Equipment used: Rolling walker (2 wheels)                      Balance Overall balance  assessment: Mild deficits observed, not formally tested                                         ADL either performed or assessed with clinical judgement   ADL                                         General ADL Comments: Educated on sequence and technique of dressing, building up sitting tolerance, positioning in bed, not bending over to brush teeth, use wet wipes for back peri care, using hand held shower head so as to not bend or twist when showering.     Vision Patient Visual Report: No change from baseline              Pertinent Vitals/Pain Pain Assessment Pain Assessment: 0-10 Pain Score: 3  Pain Location: incisional Pain Descriptors / Indicators: Sore Pain Intervention(s): Limited activity within patient's tolerance, Monitored during session, Repositioned     Extremity/Trunk Assessment Upper Extremity Assessment Upper Extremity Assessment: Overall WFL for tasks assessed           Communication Communication Communication: No apparent difficulties   Cognition Arousal: Alert Behavior During Therapy: WFL for tasks assessed/performed Overall Cognitive Status: Within Functional Limits for tasks assessed  Home Living Family/patient expects to be discharged to:: Private residence Living Arrangements: Spouse/significant other Available Help at Discharge: Family;Available 24 hours/day Type of Home: House Home Access: Ramped entrance     Home Layout: One level     Bathroom Shower/Tub: Walk-in shower;Door;Curtain   Bathroom Toilet: Standard     Home Equipment: BSC/3in1;Shower seat          Prior Functioning/Environment Prior Level of Function : Independent/Modified Independent;Driving                        OT Problem List: Decreased range of motion;Impaired balance (sitting and/or standing);Pain         OT Goals(Current goals can be found  in the care plan section) Acute Rehab OT Goals Patient Stated Goal: to go home today         AM-PAC OT "6 Clicks" Daily Activity     Outcome Measure Help from another person eating meals?: None Help from another person taking care of personal grooming?: None Help from another person toileting, which includes using toliet, bedpan, or urinal?: None Help from another person bathing (including washing, rinsing, drying)?: A Little (LB) Help from another person to put on and taking off regular upper body clothing?: None Help from another person to put on and taking off regular lower body clothing?: A Lot (cannot cross legs in figure 4 fashion as of yet, husband can A her with LB tasks until she can cross legs) 6 Click Score: 21   End of Session Equipment Utilized During Treatment: Rolling walker (2 wheels) Nurse Communication:  (no further OT needs)  Activity Tolerance: Patient tolerated treatment well Patient left: in bed;with call bell/phone within reach  OT Visit Diagnosis: Unsteadiness on feet (R26.81);Other abnormalities of gait and mobility (R26.89);Pain Pain - part of body:  (incisional)                Time: 2956-2130 OT Time Calculation (min): 27 min Charges:  OT General Charges $OT Visit: 1 Visit OT Evaluation $OT Eval Moderate Complexity: 1 Mod OT Treatments $Self Care/Home Management : 8-22 mins  Lindon Romp OT Acute Rehabilitation Services Office [phone]    Kayla Bailey 04/08/2023, 9:03 AM

## 2023-04-12 ENCOUNTER — Encounter (HOSPITAL_COMMUNITY): Payer: Self-pay | Admitting: Neurosurgery

## 2023-04-12 MED FILL — Heparin Sodium (Porcine) Inj 1000 Unit/ML: INTRAMUSCULAR | Qty: 30 | Status: AC

## 2023-04-12 MED FILL — Sodium Chloride IV Soln 0.9%: INTRAVENOUS | Qty: 1000 | Status: AC

## 2023-06-21 ENCOUNTER — Other Ambulatory Visit: Payer: Medicare Other
